# Patient Record
Sex: Male | Born: 1965
Health system: Southern US, Community
[De-identification: ages and names within clinical notes are randomized; demographics above are authoritative.]

## PROBLEM LIST (undated history)

## (undated) DIAGNOSIS — E785 Hyperlipidemia, unspecified: Secondary | ICD-10-CM

## (undated) DIAGNOSIS — I2510 Atherosclerotic heart disease of native coronary artery without angina pectoris: Secondary | ICD-10-CM

## (undated) DIAGNOSIS — Z973 Presence of spectacles and contact lenses: Secondary | ICD-10-CM

## (undated) HISTORY — PX: MOLE REMOVAL: SHX2046

## (undated) HISTORY — DX: Atherosclerotic heart disease of native coronary artery without angina pectoris: I25.10

---

## 1998-12-31 ENCOUNTER — Ambulatory Visit (HOSPITAL_BASED_OUTPATIENT_CLINIC_OR_DEPARTMENT_OTHER): Admission: RE | Admit: 1998-12-31 | Discharge: 1998-12-31 | Payer: Self-pay | Admitting: Surgery

## 2010-04-10 HISTORY — PX: KNEE ARTHROSCOPY W/ LATERAL RELEASE / MEDIAL IMBRICATION: SHX1874

## 2014-03-10 ENCOUNTER — Other Ambulatory Visit: Payer: Self-pay | Admitting: Orthopedic Surgery

## 2014-04-06 ENCOUNTER — Encounter (HOSPITAL_BASED_OUTPATIENT_CLINIC_OR_DEPARTMENT_OTHER): Payer: Self-pay | Admitting: *Deleted

## 2014-04-06 NOTE — Progress Notes (Signed)
No labs needed

## 2014-04-07 ENCOUNTER — Encounter (HOSPITAL_BASED_OUTPATIENT_CLINIC_OR_DEPARTMENT_OTHER)
Admission: RE | Disposition: A | Discharge status: Home / Self Care | Payer: Self-pay | Source: Ambulatory Visit | Attending: Orthopedic Surgery

## 2014-04-07 ENCOUNTER — Ambulatory Visit (HOSPITAL_BASED_OUTPATIENT_CLINIC_OR_DEPARTMENT_OTHER): Payer: BC Managed Care – PPO | Admitting: Certified Registered"

## 2014-04-07 ENCOUNTER — Ambulatory Visit (HOSPITAL_BASED_OUTPATIENT_CLINIC_OR_DEPARTMENT_OTHER)
Admission: RE | Admit: 2014-04-07 | Discharge: 2014-04-07 | Disposition: A | Discharge status: Home / Self Care | Payer: BC Managed Care – PPO | Source: Ambulatory Visit | Attending: Orthopedic Surgery | Admitting: Orthopedic Surgery

## 2014-04-07 ENCOUNTER — Encounter (HOSPITAL_BASED_OUTPATIENT_CLINIC_OR_DEPARTMENT_OTHER): Payer: Self-pay | Admitting: *Deleted

## 2014-04-07 DIAGNOSIS — E785 Hyperlipidemia, unspecified: Secondary | ICD-10-CM | POA: Diagnosis not present

## 2014-04-07 DIAGNOSIS — R2232 Localized swelling, mass and lump, left upper limb: Secondary | ICD-10-CM | POA: Diagnosis present

## 2014-04-07 DIAGNOSIS — D3612 Benign neoplasm of peripheral nerves and autonomic nervous system, upper limb, including shoulder: Secondary | ICD-10-CM | POA: Insufficient documentation

## 2014-04-07 DIAGNOSIS — F17290 Nicotine dependence, other tobacco product, uncomplicated: Secondary | ICD-10-CM | POA: Insufficient documentation

## 2014-04-07 HISTORY — PX: MASS EXCISION: SHX2000

## 2014-04-07 HISTORY — DX: Hyperlipidemia, unspecified: E78.5

## 2014-04-07 HISTORY — DX: Presence of spectacles and contact lenses: Z97.3

## 2014-04-07 LAB — POCT HEMOGLOBIN-HEMACUE: Hemoglobin: 14.4 g/dL (ref 13.0–17.0)

## 2014-04-07 SURGERY — EXCISION MASS
Anesthesia: Monitor Anesthesia Care | Site: Thumb | Laterality: Left

## 2014-04-07 MED ORDER — CHLORHEXIDINE GLUCONATE 4 % EX LIQD
60.0000 mL | Freq: Once | CUTANEOUS | Status: DC
Start: 2014-04-07 — End: 2014-04-07

## 2014-04-07 MED ORDER — LACTATED RINGERS IV SOLN
INTRAVENOUS | Status: DC
  Administered 2014-04-07: 13:00:00 via INTRAVENOUS

## 2014-04-07 MED ORDER — FENTANYL CITRATE 0.05 MG/ML IJ SOLN
INTRAMUSCULAR | Status: AC
  Filled 2014-04-07: qty 4

## 2014-04-07 MED ORDER — OXYCODONE HCL 5 MG/5ML PO SOLN: 5.0000 mg | Freq: Once | ORAL | Status: DC | PRN

## 2014-04-07 MED ORDER — ONDANSETRON HCL 4 MG/2ML IJ SOLN: 4.0000 mg | Freq: Once | INTRAMUSCULAR | Status: DC | PRN

## 2014-04-07 MED ORDER — CEFAZOLIN SODIUM-DEXTROSE 2-3 GM-% IV SOLR
2.0000 g | INTRAVENOUS | Status: AC
  Administered 2014-04-07: 2 g via INTRAVENOUS

## 2014-04-07 MED ORDER — MIDAZOLAM HCL 2 MG/2ML IJ SOLN: 1.0000 mg | INTRAMUSCULAR | Status: DC | PRN

## 2014-04-07 MED ORDER — MIDAZOLAM HCL 2 MG/2ML IJ SOLN
INTRAMUSCULAR | Status: AC
  Filled 2014-04-07: qty 2

## 2014-04-07 MED ORDER — ONDANSETRON HCL 4 MG/2ML IJ SOLN
INTRAMUSCULAR | Status: DC | PRN
  Administered 2014-04-07: 4 mg via INTRAVENOUS

## 2014-04-07 MED ORDER — PROPOFOL 10 MG/ML IV EMUL
INTRAVENOUS | Status: AC
  Filled 2014-04-07: qty 50

## 2014-04-07 MED ORDER — MIDAZOLAM HCL 5 MG/5ML IJ SOLN
INTRAMUSCULAR | Status: DC | PRN
  Administered 2014-04-07: 2 mg via INTRAVENOUS

## 2014-04-07 MED ORDER — CEFAZOLIN SODIUM-DEXTROSE 2-3 GM-% IV SOLR
INTRAVENOUS | Status: AC
Start: 2014-04-07 — End: 2014-04-07
  Filled 2014-04-07: qty 50

## 2014-04-07 MED ORDER — PROPOFOL 10 MG/ML IV BOLUS
INTRAVENOUS | Status: DC | PRN
  Administered 2014-04-07 (×2): 20 mg via INTRAVENOUS

## 2014-04-07 MED ORDER — BUPIVACAINE HCL (PF) 0.25 % IJ SOLN
INTRAMUSCULAR | Status: DC | PRN
  Administered 2014-04-07: 3 mL

## 2014-04-07 MED ORDER — OXYCODONE HCL 5 MG PO TABS: 5.0000 mg | ORAL_TABLET | Freq: Once | ORAL | Status: DC | PRN

## 2014-04-07 MED ORDER — HYDROMORPHONE HCL 1 MG/ML IJ SOLN: 0.2500 mg | INTRAMUSCULAR | Status: DC | PRN

## 2014-04-07 MED ORDER — FENTANYL CITRATE 0.05 MG/ML IJ SOLN
INTRAMUSCULAR | Status: DC | PRN
  Administered 2014-04-07: 100 ug via INTRAVENOUS

## 2014-04-07 MED ORDER — LIDOCAINE HCL (PF) 0.5 % IJ SOLN
INTRAMUSCULAR | Status: DC | PRN
  Administered 2014-04-07: 30 mL via INTRAVENOUS

## 2014-04-07 MED ORDER — FENTANYL CITRATE 0.05 MG/ML IJ SOLN: 50.0000 ug | INTRAMUSCULAR | Status: DC | PRN

## 2014-04-07 MED ORDER — HYDROCODONE-ACETAMINOPHEN 5-325 MG PO TABS: ORAL_TABLET | ORAL | Status: DC

## 2014-04-07 SURGICAL SUPPLY — 54 items
APL SKNCLS STERI-STRIP NONHPOA (GAUZE/BANDAGES/DRESSINGS)
BANDAGE COBAN STERILE 2 (GAUZE/BANDAGES/DRESSINGS) IMPLANT
BANDAGE ELASTIC 3 VELCRO ST LF (GAUZE/BANDAGES/DRESSINGS) IMPLANT
BENZOIN TINCTURE PRP APPL 2/3 (GAUZE/BANDAGES/DRESSINGS) IMPLANT
BLADE MINI RND TIP GREEN BEAV (BLADE) IMPLANT
BLADE SURG 15 STRL LF DISP TIS (BLADE) ×2 IMPLANT
BLADE SURG 15 STRL SS (BLADE) ×4
BNDG CMPR 9X4 STRL LF SNTH (GAUZE/BANDAGES/DRESSINGS)
BNDG CMPR MD 5X2 ELC HKLP STRL (GAUZE/BANDAGES/DRESSINGS)
BNDG COHESIVE 1X5 TAN STRL LF (GAUZE/BANDAGES/DRESSINGS) ×1 IMPLANT
BNDG CONFORM 2 STRL LF (GAUZE/BANDAGES/DRESSINGS) IMPLANT
BNDG ELASTIC 2 VLCR STRL LF (GAUZE/BANDAGES/DRESSINGS) IMPLANT
BNDG ESMARK 4X9 LF (GAUZE/BANDAGES/DRESSINGS) IMPLANT
BNDG GAUZE 1X2.1 STRL (MISCELLANEOUS) IMPLANT
BNDG GAUZE ELAST 4 BULKY (GAUZE/BANDAGES/DRESSINGS) IMPLANT
BNDG PLASTER X FAST 3X3 WHT LF (CAST SUPPLIES) IMPLANT
BNDG PLSTR 9X3 FST ST WHT (CAST SUPPLIES)
CHLORAPREP W/TINT 26ML (MISCELLANEOUS) ×2 IMPLANT
CORDS BIPOLAR (ELECTRODE) ×2 IMPLANT
COVER BACK TABLE 60X90IN (DRAPES) ×2 IMPLANT
COVER MAYO STAND STRL (DRAPES) ×2 IMPLANT
CUFF TOURNIQUET SINGLE 18IN (TOURNIQUET CUFF) ×2 IMPLANT
DRAPE EXTREMITY T 121X128X90 (DRAPE) ×2 IMPLANT
DRAPE SURG 17X23 STRL (DRAPES) ×2 IMPLANT
GAUZE SPONGE 4X4 12PLY STRL (GAUZE/BANDAGES/DRESSINGS) ×2 IMPLANT
GAUZE XEROFORM 1X8 LF (GAUZE/BANDAGES/DRESSINGS) ×2 IMPLANT
GLOVE BIO SURGEON STRL SZ7.5 (GLOVE) ×2 IMPLANT
GLOVE BIOGEL PI IND STRL 8 (GLOVE) ×1 IMPLANT
GLOVE BIOGEL PI INDICATOR 8 (GLOVE) ×1
GOWN STRL REUS W/ TWL LRG LVL3 (GOWN DISPOSABLE) ×1 IMPLANT
GOWN STRL REUS W/TWL LRG LVL3 (GOWN DISPOSABLE) ×2
GOWN STRL REUS W/TWL XL LVL3 (GOWN DISPOSABLE) ×2 IMPLANT
NDL HYPO 25X1 1.5 SAFETY (NEEDLE) ×1 IMPLANT
NEEDLE HYPO 25X1 1.5 SAFETY (NEEDLE) ×2 IMPLANT
NS IRRIG 1000ML POUR BTL (IV SOLUTION) ×2 IMPLANT
PACK BASIN DAY SURGERY FS (CUSTOM PROCEDURE TRAY) ×2 IMPLANT
PAD CAST 3X4 CTTN HI CHSV (CAST SUPPLIES) IMPLANT
PAD CAST 4YDX4 CTTN HI CHSV (CAST SUPPLIES) IMPLANT
PADDING CAST ABS 4INX4YD NS (CAST SUPPLIES) ×1
PADDING CAST ABS COTTON 4X4 ST (CAST SUPPLIES) ×1 IMPLANT
PADDING CAST COTTON 3X4 STRL (CAST SUPPLIES)
PADDING CAST COTTON 4X4 STRL (CAST SUPPLIES)
SPONGE GAUZE 4X4 12PLY STER LF (GAUZE/BANDAGES/DRESSINGS) ×1 IMPLANT
STOCKINETTE 4X48 STRL (DRAPES) ×2 IMPLANT
STRIP CLOSURE SKIN 1/2X4 (GAUZE/BANDAGES/DRESSINGS) IMPLANT
SUT ETHILON 3 0 PS 1 (SUTURE) IMPLANT
SUT ETHILON 4 0 PS 2 18 (SUTURE) ×2 IMPLANT
SUT ETHILON 5 0 P 3 18 (SUTURE)
SUT NYLON ETHILON 5-0 P-3 1X18 (SUTURE) IMPLANT
SUT VIC AB 4-0 P2 18 (SUTURE) IMPLANT
SYR BULB 3OZ (MISCELLANEOUS) ×2 IMPLANT
SYR CONTROL 10ML LL (SYRINGE) ×2 IMPLANT
TOWEL OR 17X24 6PK STRL BLUE (TOWEL DISPOSABLE) ×4 IMPLANT
UNDERPAD 30X30 INCONTINENT (UNDERPADS AND DIAPERS) ×2 IMPLANT

## 2014-04-07 NOTE — H&P (Signed)
  Jason Chen is an 48 y.o. male.   Chief Complaint: left thumb mass HPI: 48 yo lhd male states he has had mass on left thumb x 20 years.  He does not remember any specific injury to the thumb.  It is bothersome with palpation and when he bumps it.  He wishes to have it removed.  Past Medical History  Diagnosis Date  . Hyperlipemia   . Wears glasses     Past Surgical History  Procedure Laterality Date  . Knee arthroscopy w/ lateral release / medial imbrication  2012    left  . Mole removal      History reviewed. No pertinent family history. Social History:  reports that he has been smoking Cigars.  He does not have any smokeless tobacco history on file. He reports that he drinks alcohol. He reports that he does not use illicit drugs.  Allergies: No Known Allergies  No prescriptions prior to admission    No results found for this or any previous visit (from the past 48 hour(s)).  No results found.   A comprehensive review of systems was negative except for: Eyes: positive for contacts/glasses  Height 5\' 11"  (1.803 m), weight 73.483 kg (162 lb).  General appearance: alert, cooperative and appears stated age Head: Normocephalic, without obvious abnormality, atraumatic Neck: supple, symmetrical, trachea midline Resp: clear to auscultation bilaterally Cardio: regular rate and rhythm GI: non tender Extremities: intact sensation and capillary refill all digits.  +epl/fpl/io.  mass on ulnar side of thumb.  no skin changes. Pulses: 2+ and symmetric Skin: Skin color, texture, turgor normal. No rashes or lesions Neurologic: Grossly normal Incision/Wound: none  Assessment/Plan Left thumb mass.  Non operative and operative treatment options were discussed with the patient and patient wishes to proceed with operative treatment. Risks, benefits, and alternatives of surgery were discussed and the patient agrees with the plan of care.   Jason Chen R 04/07/2014, 9:52  AM

## 2014-04-07 NOTE — Discharge Instructions (Addendum)

## 2014-04-07 NOTE — Anesthesia Preprocedure Evaluation (Signed)
Anesthesia Evaluation  Patient identified by MRN, date of birth, ID band Patient awake    Reviewed: Allergy & Precautions, H&P , NPO status , Patient's Chart, lab work & pertinent test results  Airway Mallampati: I  TM Distance: >3 FB Neck ROM: Full    Dental  (+) Teeth Intact, Dental Advisory Given   Pulmonary Current Smoker,  breath sounds clear to auscultation        Cardiovascular Rhythm:Regular Rate:Normal     Neuro/Psych    GI/Hepatic   Endo/Other    Renal/GU      Musculoskeletal   Abdominal   Peds  Hematology   Anesthesia Other Findings   Reproductive/Obstetrics                             Anesthesia Physical Anesthesia Plan  ASA: I  Anesthesia Plan: MAC and Bier Block   Post-op Pain Management:    Induction: Intravenous  Airway Management Planned: Simple Face Mask  Additional Equipment:   Intra-op Plan:   Post-operative Plan:   Informed Consent: I have reviewed the patients History and Physical, chart, labs and discussed the procedure including the risks, benefits and alternatives for the proposed anesthesia with the patient or authorized representative who has indicated his/her understanding and acceptance.   Dental advisory given  Plan Discussed with: CRNA, Surgeon and Anesthesiologist  Anesthesia Plan Comments:         Anesthesia Quick Evaluation

## 2014-04-07 NOTE — Brief Op Note (Signed)
04/07/2014  3:03 PM  PATIENT:  Jason Chen  48 y.o. male  PRE-OPERATIVE DIAGNOSIS:  LEFT THUMB MASS   POST-OPERATIVE DIAGNOSIS:  LEFT THUMB MASS   PROCEDURE:  Procedure(s): EXCISION MASS LEFT THUMB  (Left)  SURGEON:  Surgeon(s) and Role:    * Leanora Cover, MD - Primary    * Daryll Brod, MD - Assisting  PHYSICIAN ASSISTANT:   ASSISTANTS: Daryll Brod, MD   ANESTHESIA:   Bier block with sedation  EBL:  Total I/O In: 750 [I.V.:750] Out: -   BLOOD ADMINISTERED:none  DRAINS: none   LOCAL MEDICATIONS USED:  MARCAINE     SPECIMEN:  Source of Specimen:  left thumb  DISPOSITION OF SPECIMEN:  PATHOLOGY  COUNTS:  YES  TOURNIQUET:   Total Tourniquet Time Documented: Forearm (Left) - 18 minutes Total: Forearm (Left) - 18 minutes   DICTATION: .Other Dictation: Dictation Number [id number]  PLAN OF CARE: Discharge to home after PACU  PATIENT DISPOSITION:  PACU - hemodynamically stable.

## 2014-04-07 NOTE — Transfer of Care (Signed)
Immediate Anesthesia Transfer of Care Note  Patient: Jason Chen  Procedure(s) Performed: Procedure(s): EXCISION MASS LEFT THUMB  (Left)  Patient Location: PACU  Anesthesia Type:MAC and Bier block  Level of Consciousness: awake, alert  and oriented  Airway & Oxygen Therapy: Patient Spontanous Breathing and Patient connected to face mask oxygen  Post-op Assessment: Report given to PACU RN and Post -op Vital signs reviewed and stable  Post vital signs: Reviewed and stable  Complications: No apparent anesthesia complications

## 2014-04-07 NOTE — Op Note (Signed)
Jason Chen, Jason Chen             ACCOUNT NO.:  [id number]  MEDICAL RECORD NO.:  [id number]  LOCATION:                                 FACILITY:  PHYSICIAN:  Leanora Cover, MD             DATE OF BIRTH:  DATE OF PROCEDURE:  04/07/2014 DATE OF DISCHARGE:                              OPERATIVE REPORT   PREOPERATIVE DIAGNOSIS:  Left thumb mass.  POSTOPERATIVE DIAGNOSIS:  Left thumb mass.  PROCEDURE:  Excision mass left thumb approximately 1 mm.  SURGEON:  Leanora Cover, MD  ASSISTANT:  Daryll Brod, MD  ANESTHESIA:  Bier block with sedation.  IV FLUIDS:  Per anesthesia flow sheet.  ESTIMATED BLOOD LOSS:  Minimal.  COMPLICATIONS:  None.  SPECIMENS:  Left thumb mass to Pathology.  TOURNIQUET TIME:  18 minutes.  DISPOSITION:  Stable to PACU.  INDICATIONS:  Jason Chen is a 48 year old male who was noted to have a mass in his left thumb for 15-20 years.  This is bothersome to him. It is painful when bumped.  He wished to have it removed.  Risks, benefits, and alternatives of surgery were discussed including risk of blood loss, infection, damage to nerves, vessels, tendons, ligaments, bone, failure of surgery, need for additional surgery, complications with wound healing, continued pain, and recurrence of mass.  He voiced understanding of these risks and elected to proceed.  OPERATIVE COURSE:  After being identified preoperatively by myself, the patient and I agreed upon procedure and site of procedure.  Surgical site was marked.  The risks, benefits, and alternatives of surgery were reviewed and he wished to proceed.  Surgical consent had been signed. He was given IV Ancef as preoperative antibiotic prophylaxis.  He was transported to the operating room and placed on the operating room table in supine position with the left upper extremity on arm board.  Bier block anesthesia was induced by anesthesiologist.  The left upper extremity was prepped and draped in normal  sterile orthopedic fashion. A surgical pause was performed between surgeons, anesthesia, and operating room staff, and all were in agreement as to the patient, procedure, and site of procedure.  Tourniquet at the proximal aspect of the forearm had been inflated for the Bier block.  Incision was made over the mass and carried into subcutaneous tissues by spreading technique.  Bipolar electrocautery was used to obtain hemostasis.  The mass was easily identified and came out easily.  It had minimal adherence.  It had 2 small vessels going into it that were treated with bipolar.  The mass was sent to Pathology for examination.  It measured approximately 1 cm.  The skin was placed back over top of the wound and the area palpated.  No remaining mass was noted.  The wound was copiously irrigated with sterile saline.  It was closed with 4-0 nylon in a horizontal mattress fashion.  The wound was injected with 3 mL of 0.25% plain Marcaine to aid in postoperative analgesia.  It was then dressed with sterile Xeroform, 4 x 4 and wrapped with a Coban dressing lightly.  Tourniquet was deflated at 18 minutes.  Fingertips were pink with brisk capillary  refill after deflation of the tourniquet. Operative drapes were broken down, and the patient was awoken from anesthesia safely.  He was transferred back to stretcher and taken to PACU in stable condition.  I will see him back in the office in 1 week for postoperative followup.  I will give him Norco 5/325, 1-2 p.o. q.6 hours p.r.n. pain, dispensed #20.     Leanora Cover, MD     KK/MEDQ  D:  04/07/2014  T:  04/07/2014  Job:  [id number]

## 2014-04-07 NOTE — Anesthesia Postprocedure Evaluation (Signed)
  Anesthesia Post-op Note  Patient: Jason Chen  Procedure(s) Performed: Procedure(s): EXCISION MASS LEFT THUMB  (Left)  Patient Location: PACU  Anesthesia Type: MAC, Bier Block   Level of Consciousness: awake, alert  and oriented  Airway and Oxygen Therapy: Patient Spontanous Breathing  Post-op Pain: none  Post-op Assessment: Post-op Vital signs reviewed  Post-op Vital Signs: Reviewed  Last Vitals:  Filed Vitals:   04/07/14 1530  BP: 126/89  Pulse: 68  Temp:   Resp: 13    Complications: No apparent anesthesia complications

## 2014-04-07 NOTE — Op Note (Signed)
[id number] 

## 2014-04-08 ENCOUNTER — Encounter (HOSPITAL_BASED_OUTPATIENT_CLINIC_OR_DEPARTMENT_OTHER): Payer: Self-pay | Admitting: Orthopedic Surgery

## 2015-08-17 DIAGNOSIS — E785 Hyperlipidemia, unspecified: Secondary | ICD-10-CM | POA: Diagnosis not present

## 2015-10-18 DIAGNOSIS — R5383 Other fatigue: Secondary | ICD-10-CM | POA: Diagnosis not present

## 2015-10-18 DIAGNOSIS — E291 Testicular hypofunction: Secondary | ICD-10-CM | POA: Diagnosis not present

## 2015-10-18 DIAGNOSIS — R739 Hyperglycemia, unspecified: Secondary | ICD-10-CM | POA: Diagnosis not present

## 2015-10-18 DIAGNOSIS — N401 Enlarged prostate with lower urinary tract symptoms: Secondary | ICD-10-CM | POA: Diagnosis not present

## 2015-11-04 DIAGNOSIS — E559 Vitamin D deficiency, unspecified: Secondary | ICD-10-CM | POA: Diagnosis not present

## 2015-11-04 DIAGNOSIS — N401 Enlarged prostate with lower urinary tract symptoms: Secondary | ICD-10-CM | POA: Diagnosis not present

## 2015-11-04 DIAGNOSIS — E291 Testicular hypofunction: Secondary | ICD-10-CM | POA: Diagnosis not present

## 2015-11-04 DIAGNOSIS — R5383 Other fatigue: Secondary | ICD-10-CM | POA: Diagnosis not present

## 2016-01-14 DIAGNOSIS — H5213 Myopia, bilateral: Secondary | ICD-10-CM | POA: Diagnosis not present

## 2016-02-14 DIAGNOSIS — R5383 Other fatigue: Secondary | ICD-10-CM | POA: Diagnosis not present

## 2016-02-14 DIAGNOSIS — E785 Hyperlipidemia, unspecified: Secondary | ICD-10-CM | POA: Diagnosis not present

## 2016-02-14 DIAGNOSIS — Z23 Encounter for immunization: Secondary | ICD-10-CM | POA: Diagnosis not present

## 2016-02-14 DIAGNOSIS — Z0389 Encounter for observation for other suspected diseases and conditions ruled out: Secondary | ICD-10-CM | POA: Diagnosis not present

## 2016-02-14 DIAGNOSIS — R739 Hyperglycemia, unspecified: Secondary | ICD-10-CM | POA: Diagnosis not present

## 2016-02-14 DIAGNOSIS — E291 Testicular hypofunction: Secondary | ICD-10-CM | POA: Diagnosis not present

## 2016-02-14 DIAGNOSIS — Z0000 Encounter for general adult medical examination without abnormal findings: Secondary | ICD-10-CM | POA: Diagnosis not present

## 2016-02-14 DIAGNOSIS — E559 Vitamin D deficiency, unspecified: Secondary | ICD-10-CM | POA: Diagnosis not present

## 2016-06-15 DIAGNOSIS — R739 Hyperglycemia, unspecified: Secondary | ICD-10-CM | POA: Diagnosis not present

## 2016-06-15 DIAGNOSIS — N401 Enlarged prostate with lower urinary tract symptoms: Secondary | ICD-10-CM | POA: Diagnosis not present

## 2016-06-15 DIAGNOSIS — E291 Testicular hypofunction: Secondary | ICD-10-CM | POA: Diagnosis not present

## 2016-06-15 DIAGNOSIS — E785 Hyperlipidemia, unspecified: Secondary | ICD-10-CM | POA: Diagnosis not present

## 2016-09-19 DIAGNOSIS — E559 Vitamin D deficiency, unspecified: Secondary | ICD-10-CM | POA: Diagnosis not present

## 2016-09-19 DIAGNOSIS — E291 Testicular hypofunction: Secondary | ICD-10-CM | POA: Diagnosis not present

## 2016-09-19 DIAGNOSIS — E785 Hyperlipidemia, unspecified: Secondary | ICD-10-CM | POA: Diagnosis not present

## 2017-01-04 DIAGNOSIS — Z85820 Personal history of malignant melanoma of skin: Secondary | ICD-10-CM | POA: Diagnosis not present

## 2017-01-04 DIAGNOSIS — L814 Other melanin hyperpigmentation: Secondary | ICD-10-CM | POA: Diagnosis not present

## 2017-01-04 DIAGNOSIS — D1801 Hemangioma of skin and subcutaneous tissue: Secondary | ICD-10-CM | POA: Diagnosis not present

## 2017-01-04 DIAGNOSIS — D225 Melanocytic nevi of trunk: Secondary | ICD-10-CM | POA: Diagnosis not present

## 2017-01-04 DIAGNOSIS — L570 Actinic keratosis: Secondary | ICD-10-CM | POA: Diagnosis not present

## 2017-02-14 DIAGNOSIS — R5383 Other fatigue: Secondary | ICD-10-CM | POA: Diagnosis not present

## 2017-02-14 DIAGNOSIS — Z23 Encounter for immunization: Secondary | ICD-10-CM | POA: Diagnosis not present

## 2017-02-14 DIAGNOSIS — E785 Hyperlipidemia, unspecified: Secondary | ICD-10-CM | POA: Diagnosis not present

## 2017-02-14 DIAGNOSIS — Z0389 Encounter for observation for other suspected diseases and conditions ruled out: Secondary | ICD-10-CM | POA: Diagnosis not present

## 2017-02-14 DIAGNOSIS — E291 Testicular hypofunction: Secondary | ICD-10-CM | POA: Diagnosis not present

## 2017-02-14 DIAGNOSIS — Z125 Encounter for screening for malignant neoplasm of prostate: Secondary | ICD-10-CM | POA: Diagnosis not present

## 2017-02-14 DIAGNOSIS — N401 Enlarged prostate with lower urinary tract symptoms: Secondary | ICD-10-CM | POA: Diagnosis not present

## 2017-02-14 DIAGNOSIS — Z0000 Encounter for general adult medical examination without abnormal findings: Secondary | ICD-10-CM | POA: Diagnosis not present

## 2017-02-14 DIAGNOSIS — R739 Hyperglycemia, unspecified: Secondary | ICD-10-CM | POA: Diagnosis not present

## 2017-02-14 DIAGNOSIS — E559 Vitamin D deficiency, unspecified: Secondary | ICD-10-CM | POA: Diagnosis not present

## 2017-03-07 DIAGNOSIS — H5213 Myopia, bilateral: Secondary | ICD-10-CM | POA: Diagnosis not present

## 2017-04-04 DIAGNOSIS — H43812 Vitreous degeneration, left eye: Secondary | ICD-10-CM | POA: Diagnosis not present

## 2017-05-11 DIAGNOSIS — J209 Acute bronchitis, unspecified: Secondary | ICD-10-CM | POA: Diagnosis not present

## 2017-05-17 DIAGNOSIS — Z23 Encounter for immunization: Secondary | ICD-10-CM | POA: Diagnosis not present

## 2017-06-06 DIAGNOSIS — R972 Elevated prostate specific antigen [PSA]: Secondary | ICD-10-CM | POA: Diagnosis not present

## 2017-06-13 DIAGNOSIS — E291 Testicular hypofunction: Secondary | ICD-10-CM | POA: Diagnosis not present

## 2017-06-13 DIAGNOSIS — R972 Elevated prostate specific antigen [PSA]: Secondary | ICD-10-CM | POA: Diagnosis not present

## 2017-06-20 DIAGNOSIS — R102 Pelvic and perineal pain: Secondary | ICD-10-CM | POA: Diagnosis not present

## 2017-06-20 DIAGNOSIS — E291 Testicular hypofunction: Secondary | ICD-10-CM | POA: Diagnosis not present

## 2017-06-20 DIAGNOSIS — R972 Elevated prostate specific antigen [PSA]: Secondary | ICD-10-CM | POA: Diagnosis not present

## 2017-06-29 DIAGNOSIS — T7840XA Allergy, unspecified, initial encounter: Secondary | ICD-10-CM | POA: Diagnosis not present

## 2017-06-29 DIAGNOSIS — R220 Localized swelling, mass and lump, head: Secondary | ICD-10-CM | POA: Diagnosis not present

## 2017-08-15 DIAGNOSIS — E291 Testicular hypofunction: Secondary | ICD-10-CM | POA: Diagnosis not present

## 2017-08-15 DIAGNOSIS — R972 Elevated prostate specific antigen [PSA]: Secondary | ICD-10-CM | POA: Diagnosis not present

## 2017-08-15 DIAGNOSIS — R8271 Bacteriuria: Secondary | ICD-10-CM | POA: Diagnosis not present

## 2017-08-15 DIAGNOSIS — R825 Elevated urine levels of drugs, medicaments and biological substances: Secondary | ICD-10-CM | POA: Diagnosis not present

## 2017-09-04 DIAGNOSIS — R972 Elevated prostate specific antigen [PSA]: Secondary | ICD-10-CM | POA: Diagnosis not present

## 2017-09-04 DIAGNOSIS — E785 Hyperlipidemia, unspecified: Secondary | ICD-10-CM | POA: Diagnosis not present

## 2017-09-04 DIAGNOSIS — R739 Hyperglycemia, unspecified: Secondary | ICD-10-CM | POA: Diagnosis not present

## 2017-09-14 DIAGNOSIS — E291 Testicular hypofunction: Secondary | ICD-10-CM | POA: Diagnosis not present

## 2017-09-14 DIAGNOSIS — R972 Elevated prostate specific antigen [PSA]: Secondary | ICD-10-CM | POA: Diagnosis not present

## 2017-10-25 DIAGNOSIS — L814 Other melanin hyperpigmentation: Secondary | ICD-10-CM | POA: Diagnosis not present

## 2017-10-25 DIAGNOSIS — C44719 Basal cell carcinoma of skin of left lower limb, including hip: Secondary | ICD-10-CM | POA: Diagnosis not present

## 2017-10-25 DIAGNOSIS — D2262 Melanocytic nevi of left upper limb, including shoulder: Secondary | ICD-10-CM | POA: Diagnosis not present

## 2017-10-25 DIAGNOSIS — D2261 Melanocytic nevi of right upper limb, including shoulder: Secondary | ICD-10-CM | POA: Diagnosis not present

## 2017-10-25 DIAGNOSIS — Z85820 Personal history of malignant melanoma of skin: Secondary | ICD-10-CM | POA: Diagnosis not present

## 2017-11-28 DIAGNOSIS — R972 Elevated prostate specific antigen [PSA]: Secondary | ICD-10-CM | POA: Diagnosis not present

## 2018-02-18 DIAGNOSIS — R911 Solitary pulmonary nodule: Secondary | ICD-10-CM | POA: Diagnosis not present

## 2018-02-18 DIAGNOSIS — Z23 Encounter for immunization: Secondary | ICD-10-CM | POA: Diagnosis not present

## 2018-02-18 DIAGNOSIS — R972 Elevated prostate specific antigen [PSA]: Secondary | ICD-10-CM | POA: Diagnosis not present

## 2018-02-18 DIAGNOSIS — R5383 Other fatigue: Secondary | ICD-10-CM | POA: Diagnosis not present

## 2018-02-18 DIAGNOSIS — R739 Hyperglycemia, unspecified: Secondary | ICD-10-CM | POA: Diagnosis not present

## 2018-02-18 DIAGNOSIS — E559 Vitamin D deficiency, unspecified: Secondary | ICD-10-CM | POA: Diagnosis not present

## 2018-02-18 DIAGNOSIS — Z0000 Encounter for general adult medical examination without abnormal findings: Secondary | ICD-10-CM | POA: Diagnosis not present

## 2018-02-18 DIAGNOSIS — Z0389 Encounter for observation for other suspected diseases and conditions ruled out: Secondary | ICD-10-CM | POA: Diagnosis not present

## 2018-02-18 DIAGNOSIS — E785 Hyperlipidemia, unspecified: Secondary | ICD-10-CM | POA: Diagnosis not present

## 2018-04-30 DIAGNOSIS — L111 Transient acantholytic dermatosis [Grover]: Secondary | ICD-10-CM | POA: Diagnosis not present

## 2018-04-30 DIAGNOSIS — L308 Other specified dermatitis: Secondary | ICD-10-CM | POA: Diagnosis not present

## 2018-09-03 DIAGNOSIS — L0101 Non-bullous impetigo: Secondary | ICD-10-CM | POA: Diagnosis not present

## 2018-09-03 DIAGNOSIS — L111 Transient acantholytic dermatosis [Grover]: Secondary | ICD-10-CM | POA: Diagnosis not present

## 2018-09-03 DIAGNOSIS — L309 Dermatitis, unspecified: Secondary | ICD-10-CM | POA: Diagnosis not present

## 2018-09-03 DIAGNOSIS — L011 Impetiginization of other dermatoses: Secondary | ICD-10-CM | POA: Diagnosis not present

## 2018-10-28 ENCOUNTER — Other Ambulatory Visit: Payer: Self-pay

## 2018-10-28 DIAGNOSIS — Z20822 Contact with and (suspected) exposure to covid-19: Secondary | ICD-10-CM

## 2018-11-01 LAB — NOVEL CORONAVIRUS, NAA: SARS-CoV-2, NAA: NOT DETECTED

## 2018-12-30 DIAGNOSIS — R972 Elevated prostate specific antigen [PSA]: Secondary | ICD-10-CM | POA: Diagnosis not present

## 2019-01-22 DIAGNOSIS — E291 Testicular hypofunction: Secondary | ICD-10-CM | POA: Diagnosis not present

## 2019-01-22 DIAGNOSIS — R350 Frequency of micturition: Secondary | ICD-10-CM | POA: Diagnosis not present

## 2019-01-22 DIAGNOSIS — R972 Elevated prostate specific antigen [PSA]: Secondary | ICD-10-CM | POA: Diagnosis not present

## 2019-01-22 DIAGNOSIS — R3912 Poor urinary stream: Secondary | ICD-10-CM | POA: Diagnosis not present

## 2019-01-28 ENCOUNTER — Other Ambulatory Visit: Payer: Self-pay

## 2019-01-28 DIAGNOSIS — Z20822 Contact with and (suspected) exposure to covid-19: Secondary | ICD-10-CM

## 2019-01-28 DIAGNOSIS — Z20828 Contact with and (suspected) exposure to other viral communicable diseases: Secondary | ICD-10-CM | POA: Diagnosis not present

## 2019-01-29 LAB — NOVEL CORONAVIRUS, NAA: SARS-CoV-2, NAA: NOT DETECTED

## 2019-02-10 ENCOUNTER — Other Ambulatory Visit: Payer: Self-pay

## 2019-02-10 DIAGNOSIS — Z20822 Contact with and (suspected) exposure to covid-19: Secondary | ICD-10-CM

## 2019-02-11 LAB — NOVEL CORONAVIRUS, NAA: SARS-CoV-2, NAA: NOT DETECTED

## 2019-02-13 ENCOUNTER — Other Ambulatory Visit: Payer: Self-pay

## 2019-02-13 DIAGNOSIS — Z20822 Contact with and (suspected) exposure to covid-19: Secondary | ICD-10-CM

## 2019-02-14 LAB — NOVEL CORONAVIRUS, NAA: SARS-CoV-2, NAA: NOT DETECTED

## 2019-02-24 ENCOUNTER — Encounter (INDEPENDENT_AMBULATORY_CARE_PROVIDER_SITE_OTHER): Payer: Self-pay | Admitting: Internal Medicine

## 2019-02-26 DIAGNOSIS — L738 Other specified follicular disorders: Secondary | ICD-10-CM | POA: Diagnosis not present

## 2019-02-26 DIAGNOSIS — D1801 Hemangioma of skin and subcutaneous tissue: Secondary | ICD-10-CM | POA: Diagnosis not present

## 2019-02-26 DIAGNOSIS — L720 Epidermal cyst: Secondary | ICD-10-CM | POA: Diagnosis not present

## 2019-03-17 ENCOUNTER — Encounter (INDEPENDENT_AMBULATORY_CARE_PROVIDER_SITE_OTHER): Payer: Self-pay | Admitting: Internal Medicine

## 2019-03-17 ENCOUNTER — Other Ambulatory Visit: Payer: Self-pay

## 2019-03-17 ENCOUNTER — Ambulatory Visit (INDEPENDENT_AMBULATORY_CARE_PROVIDER_SITE_OTHER): Payer: BC Managed Care – PPO | Admitting: Internal Medicine

## 2019-03-17 VITALS — BP 110/60 | HR 72 | Ht 71.0 in | Wt 161.4 lb

## 2019-03-17 DIAGNOSIS — Z131 Encounter for screening for diabetes mellitus: Secondary | ICD-10-CM

## 2019-03-17 DIAGNOSIS — Z8249 Family history of ischemic heart disease and other diseases of the circulatory system: Secondary | ICD-10-CM

## 2019-03-17 DIAGNOSIS — E785 Hyperlipidemia, unspecified: Secondary | ICD-10-CM

## 2019-03-17 DIAGNOSIS — N401 Enlarged prostate with lower urinary tract symptoms: Secondary | ICD-10-CM | POA: Diagnosis not present

## 2019-03-17 DIAGNOSIS — N3943 Post-void dribbling: Secondary | ICD-10-CM

## 2019-03-17 DIAGNOSIS — Z0001 Encounter for general adult medical examination with abnormal findings: Secondary | ICD-10-CM | POA: Diagnosis not present

## 2019-03-17 DIAGNOSIS — E782 Mixed hyperlipidemia: Secondary | ICD-10-CM

## 2019-03-17 DIAGNOSIS — E559 Vitamin D deficiency, unspecified: Secondary | ICD-10-CM | POA: Diagnosis not present

## 2019-03-17 DIAGNOSIS — Z139 Encounter for screening, unspecified: Secondary | ICD-10-CM | POA: Diagnosis not present

## 2019-03-17 DIAGNOSIS — N400 Enlarged prostate without lower urinary tract symptoms: Secondary | ICD-10-CM

## 2019-03-17 HISTORY — DX: Benign prostatic hyperplasia without lower urinary tract symptoms: N40.0

## 2019-03-17 HISTORY — DX: Hyperlipidemia, unspecified: E78.5

## 2019-03-17 NOTE — Progress Notes (Signed)
Chief Complaint: This 53 year old man comes in for an annual physical exam and to address his chronic conditions which are described below. HPI: He has a history of hyperlipidemia which is not severe but at one point he did take statin therapy but he has not been taking any recently.  His father had a heart attack at the age of 36 years old apparently.  The patient himself does not have any symptoms whatsoever of coronary artery disease.  He exercises on a regular basis and tries to eat healthy. He does have BPH symptoms and takes time lotion for this which have helped his symptoms tremendously.  He also takes Cialis sometimes for erectile dysfunction but also this does seem to help his BPH symptoms. In the past, he had prediabetes but more recently his hemoglobin A1c has been acceptable.  Past Medical History:  Diagnosis Date  . BPH (benign prostatic hyperplasia) 03/17/2019  . HLD (hyperlipidemia) 03/17/2019  . Hyperlipemia   . Wears glasses    Past Surgical History:  Procedure Laterality Date  . KNEE ARTHROSCOPY W/ LATERAL RELEASE / MEDIAL IMBRICATION  2012   left  . MASS EXCISION Left 04/07/2014   Procedure: EXCISION MASS LEFT THUMB ;  Surgeon: Leanora Cover, MD;  Location: [facility];  Service: Orthopedics;  Laterality: Left;  . 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    Social History   Tobacco Use  . Smoking status: Never Smoker  . Smokeless tobacco: Never Used  Substance Use Topics  . Alcohol use: Yes    Alcohol/week: 5.0 standard drinks    Types: 5 Cans of beer per week      Allergies: No Known Allergies   Current Meds  Medication Sig  . tadalafil (CIALIS) 5 MG tablet Take 5 mg by mouth daily.  . tamsulosin (FLOMAX) 0.4 MG CAPS capsule Take 0.4 mg by mouth daily.      EBR:AXENM from the symptoms mentioned above,there are no other symptoms referable to all systems  reviewed.  Physical Exam: Blood pressure 110/60, pulse 72, height '5\' 11"'  (1.803 m), weight 161 lb 6.4 oz (73.2 kg). Vitals with BMI 03/17/2019 04/07/2014 04/07/2014  Height '5\' 11"'  - -  Weight 161 lbs 6 oz - -  BMI 07.68 - -  Systolic 088 110 315  Diastolic 60 66 89  Pulse 72 65 68      He looks systemically well. General: Alert, cooperative, and appears to be the stated age.No pallor.  No jaundice.  No clubbing. Head: Normocephalic Eyes: Sclera white, pupils equal and reactive to light, red reflex x 2,  Ears: Normal bilaterally Oral cavity: Lips, mucosa, and tongue normal: Teeth and gums normal Neck: No adenopathy, supple, symmetrical, trachea midline, and thyroid does not appear enlarged Respiratory: Clear to auscultation bilaterally.No wheezing, crackles or bronchial breathing. Cardiovascular: Heart sounds are present and appear to be normal without murmurs or added sounds.  No carotid bruits.  Peripheral pulses are present and equal bilaterally.: Gastrointestinal:positive bowel sounds, no hepatosplenomegaly.  No masses felt.No tenderness. Skin: Clear, No rashes noted.No worrisome skin lesions seen. Neurological: Grossly intact without focal findings, cranial nerves II through XII intact, muscle strength equal bilaterally Musculoskeletal: No acute joint abnormalities noted.Full range of movement noted with joints. Psychiatric: Affect appropriate, non-anxious.    Assessment  1. Encounter for general adult medical examination with abnormal findings   2. Mixed hyperlipidemia  3. Benign prostatic hyperplasia with post-void dribbling   4. Vitamin D deficiency disease   5. Screening for diabetes mellitus   6. FH: heart attack     Tests Ordered:   Orders Placed This Encounter  Procedures  . CBC  . CMP with eGFR([facility])  . Hemoglobin A1c  . Lipid Panel  . T3, Free  . T4  . TSH  . Vitamin D, 25-hydroxy  . Ambulatory referral to Cardiology     Plan  1. Blood work is  ordered as above. 2. He was given Tdap vaccination today. 3. At his request, I will refer him to cardiology in view of his father having early heart attack and his history of hyperlipidemia.  He may want to get a stress test done. 4. Further recommendations will depend on blood results and I will see him in 1 years time for an annual physical exam.     No orders of the defined types were placed in this encounter.     C    03/17/2019, 10:43 AM

## 2019-03-17 NOTE — Patient Instructions (Signed)
[facility] Dietary Recommendations for Weight Loss What to Avoid . Avoid added sugars o Often added sugar can be found in processed foods such as many condiments, dry cereals, cakes, cookies, chips, crisps, crackers, candies, sweetened drinks, etc.  o Read labels and AVOID/DECREASE use of foods with the following in their ingredient list: Sugar, fructose, high fructose corn syrup, sucrose, glucose, maltose, dextrose, molasses, cane sugar, brown sugar, any type of syrup, agave nectar, etc.   . Avoid snacking in between meals . Avoid foods made with flour o If you are going to eat food made with flour, choose those made with whole-grains; and, minimize your consumption as much as is tolerable . Avoid processed foods o These foods are generally stocked in the middle of the grocery store. Focus on shopping on the perimeter of the grocery.  What to Include . Vegetables o GREEN LEAFY VEGETABLES: Kale, spinach, mustard greens, collard greens, cabbage, broccoli, etc. o OTHER: Asparagus, cauliflower, eggplant, carrots, peas, Brussel sprouts, tomatoes, bell peppers, zucchini, beets, cucumbers, etc. . Grains, seeds, and legumes o Beans: kidney beans, black eyed peas, garbanzo beans, black beans, pinto beans, etc. o Whole, unrefined grains: brown rice, barley, bulgur, oatmeal, etc. . Healthy fats  o Avoid highly processed fats such as vegetable oil o Examples of healthy fats: avocado, olives, virgin olive oil, dark chocolate (?72% Cocoa), nuts (peanuts, almonds, walnuts, cashews, pecans, etc.) . Low - Moderate Intake of Animal Sources of Protein o Meat sources: chicken, turkey, salmon, tuna. Limit to 4 ounces of meat at one time. o Consider limiting dairy sources, but when choosing dairy focus on: PLAIN Greek yogurt, cottage cheese, high-protein milk . Fruit o Choose berries  When to Eat . Intermittent Fasting: o Choosing not to eat for a specific time period, but DO FOCUS ON HYDRATION  when fasting o Multiple Techniques: - Time Restricted Eating: eat 3 meals in a day, each meal lasting no more than 60 minutes, no snacks between meals - 16-18 hour fast: fast for 16 to 18 hours up to 7 days a week. Often suggested to start with 2-3 nonconsecutive days per week.  . Remember the time you sleep is counted as fasting.  . Examples of eating schedule: Fast from 7:00pm-11:00am. Eat between 11:00am-7:00pm.  - 24-hour fast: fast for 24 hours up to every other day. Often suggested to start with 1 day per week . Remember the time you sleep is counted as fasting . Examples of eating schedule:  o Eating day: eat 2-3 meals on your eating day. If doing 2 meals, each meal should last no more than 90 minutes. If doing 3 meals, each meal should last no more than 60 minutes. Finish last meal by 7:00pm. o Fasting day: Fast until 7:00pm.  o IF YOU FEEL UNWELL FOR ANY REASON/IN ANY WAY WHEN FASTING, STOP FASTING BY EATING A NUTRITIOUS SNACK OR LIGHT MEAL o ALWAYS FOCUS ON HYDRATION DURING FASTS - Acceptable Hydration sources: water, broths, tea/coffee (black tea/coffee is best but using a small amount of whole-fat dairy products in coffee/tea is acceptable).  - Poor Hydration Sources: anything with sugar or artificial sweeteners added to it  These recommendations have been developed for patients that are actively receiving medical care from either Dr. Yardley Lekas or Sarah Gray, DNP, NP-C at [facility]. These recommendations are developed for patients with specific medical conditions and are not meant to be distributed or used by others that are not actively receiving care from either provider listed   above at [facility]. It is not appropriate to participate in the above eating plans without proper medical supervision.   Reference: Fung, J. The obesity code. [place]: [place]; 2016.   

## 2019-03-18 LAB — COMPLETE METABOLIC PANEL WITH GFR
AG Ratio: 1.8 (calc) (ref 1.0–2.5)
ALT: 36 U/L (ref 9–46)
AST: 21 U/L (ref 10–35)
Albumin: 4.6 g/dL (ref 3.6–5.1)
Alkaline phosphatase (APISO): 96 U/L (ref 35–144)
BUN: 19 mg/dL (ref 7–25)
CO2: 25 mmol/L (ref 20–32)
Calcium: 9.8 mg/dL (ref 8.6–10.3)
Chloride: 105 mmol/L (ref 98–110)
Creat: 0.94 mg/dL (ref 0.70–1.33)
GFR, Est African American: 106 mL/min/{1.73_m2} (ref >=60)
GFR, Est Non African American: 92 mL/min/{1.73_m2} (ref >=60)
Globulin: 2.6 g/dL (calc) (ref 1.9–3.7)
Glucose, Bld: 101 mg/dL — ABNORMAL HIGH (ref 65–99)
Potassium: 5 mmol/L (ref 3.5–5.3)
Sodium: 140 mmol/L (ref 135–146)
Total Bilirubin: 0.5 mg/dL (ref 0.2–1.2)
Total Protein: 7.2 g/dL (ref 6.1–8.1)

## 2019-03-18 LAB — CBC
HCT: 44.3 % (ref 38.5–50.0)
Hemoglobin: 15.3 g/dL (ref 13.2–17.1)
MCH: 30.1 pg (ref 27.0–33.0)
MCHC: 34.5 g/dL (ref 32.0–36.0)
MCV: 87.2 fL (ref 80.0–100.0)
MPV: 10.8 fL (ref 7.5–12.5)
Platelets: 232 10*3/uL (ref 140–400)
RBC: 5.08 10*6/uL (ref 4.20–5.80)
RDW: 12.5 % (ref 11.0–15.0)
WBC: 5.3 10*3/uL (ref 3.8–10.8)

## 2019-03-18 LAB — LIPID PANEL
Cholesterol: 249 mg/dL — ABNORMAL HIGH (ref <200)
HDL: 59 mg/dL (ref >=40)
LDL Cholesterol (Calc): 152 mg/dL (calc) — ABNORMAL HIGH
Non-HDL Cholesterol (Calc): 190 mg/dL (calc) — ABNORMAL HIGH (ref <130)
Total CHOL/HDL Ratio: 4.2 (calc) (ref <5.0)
Triglycerides: 213 mg/dL — ABNORMAL HIGH (ref <150)

## 2019-03-18 LAB — HEMOGLOBIN A1C
Hgb A1c MFr Bld: 5.4 % of total Hgb (ref <5.7)
Mean Plasma Glucose: 108 (calc)
eAG (mmol/L): 6 (calc)

## 2019-03-18 LAB — T4: T4, Total: 8 ug/dL (ref 4.9–10.5)

## 2019-03-18 LAB — TSH: TSH: 1.18 mIU/L (ref 0.40–4.50)

## 2019-03-18 LAB — VITAMIN D 25 HYDROXY (VIT D DEFICIENCY, FRACTURES): Vit D, 25-Hydroxy: 29 ng/mL — ABNORMAL LOW (ref 30–100)

## 2019-03-18 LAB — T3, FREE: T3, Free: 3.5 pg/mL (ref 2.3–4.2)

## 2019-04-25 ENCOUNTER — Other Ambulatory Visit: Payer: Self-pay

## 2019-04-25 ENCOUNTER — Encounter: Payer: Self-pay | Admitting: Cardiovascular Disease

## 2019-04-25 ENCOUNTER — Ambulatory Visit (INDEPENDENT_AMBULATORY_CARE_PROVIDER_SITE_OTHER): Payer: BC Managed Care – PPO | Admitting: Cardiovascular Disease

## 2019-04-25 VITALS — BP 122/82 | HR 72 | Ht 71.0 in | Wt 161.8 lb

## 2019-04-25 DIAGNOSIS — Z8249 Family history of ischemic heart disease and other diseases of the circulatory system: Secondary | ICD-10-CM

## 2019-04-25 DIAGNOSIS — E785 Hyperlipidemia, unspecified: Secondary | ICD-10-CM

## 2019-04-25 NOTE — Progress Notes (Signed)
Cardiology Office Note:    Date:  04/25/2019   ID:  Jason Chen, DOB December 14, 1965, MRN [id number]  PCP:  Jason Albee, MD  Cardiologist:  Jason Chen  Electrophysiologist:  None   Referring MD: Jason Albee, MD   Chief Complaint  Patient presents with  . Hyperlipidemia     History of Present Illness:    Jason Chen is a 54 y.o. male with a hx of hyperlipidemia and a strong family history of premature coronary artery disease.  We are asked to see him today by Dr. Anastasio Chen for further evaluation and management of her of his hyperlipidemia and positive family history for CAD.  He was recently seen by Dr. Anastasio Chen for a physical exam.  His total cholesterol is 249.  His HDL is 59.  His LDL is 152.  Triglyceride level is 213. Vitamin D level is 29.  No cardiac complaints,  Plays golf and tennis, plays tennis 1-2 times a week.   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  Wine, beer occasionally  + fam hx of early CAD ,  Father died at age 84.  Mother also has CAD - recent CABG ,   elevated chol   Past Medical History:  Diagnosis Date  . BPH (benign prostatic hyperplasia) 03/17/2019  . HLD (hyperlipidemia) 03/17/2019  . Hyperlipemia   . Wears glasses     Past Surgical History:  Procedure Laterality Date  . KNEE ARTHROSCOPY W/ LATERAL RELEASE / MEDIAL IMBRICATION  2012   left  . MASS EXCISION Left 04/07/2014   Procedure: EXCISION MASS LEFT THUMB ;  Surgeon: Jason Cover, MD;  Location: [facility];  Service: Orthopedics;  Laterality: Left;  . MOLE REMOVAL      Current Medications: Current Meds  Medication Sig  . augmented betamethasone dipropionate (DIPROLENE-AF) 0.05 % cream Apply 1 application topically 2 (two) times daily.  Marland Kitchen desonide (DESOWEN) 0.05 % ointment Apply 1 application topically as needed.  . tadalafil (CIALIS) 5 MG tablet Take 5 mg by mouth  daily.  . tamsulosin (FLOMAX) 0.4 MG CAPS capsule Take 0.4 mg by mouth daily.     Allergies:   Patient has no known allergies.   Social History   Socioeconomic History  . Marital status: Married    Spouse name: Not on file  . Number of children: Not on file  . Years of education: Not on file  . Highest education level: Not on file  Occupational History  . Not on file  Tobacco Use  . Smoking status: Never Smoker  . Smokeless tobacco: Never Used  Substance and Sexual Activity  . Alcohol use: Yes    Alcohol/week: 5.0 standard drinks    Types: 5 Cans of beer per week  . Drug use: No  . Sexual activity: Yes    Comment: occ cigar  Other Topics Concern  . 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   Social Determinants of Health   Financial Resource Strain:   . Difficulty of Paying Living Expenses: Not on file  Food Insecurity:   . Worried About Charity fundraiser in the Last Year: Not on file  . Ran Out of Food in the Last Year: Not on file  Transportation Needs:   . Lack of Transportation (Medical): Not on file  . Lack  of Transportation (Non-Medical): Not on file  Physical Activity:   . Days of Exercise per Week: Not on file  . Minutes of Exercise per Session: Not on file  Stress:   . Feeling of Stress : Not on file  Social Connections:   . Frequency of Communication with Friends and Family: Not on file  . Frequency of Social Gatherings with Friends and Family: Not on file  . Attends Religious Services: Not on file  . Active Member of Clubs or Organizations: Not on file  . Attends Archivist Meetings: Not on file  . Marital Status: Not on file     Family History: The patient's family history includes Cancer in his mother; Heart disease in his father and mother; Hyperlipidemia in his brother, brother, and daughter.  ROS:   Please see the history of present illness.     All other systems reviewed and are  negative.  EKGs/Labs/Other Studies Reviewed:    The following studies were reviewed today: Notes from Dr. Anastasio Chen  Indicting hyperlipidemia   Recent Labs: 03/17/2019: ALT 36; BUN 19; Creat 0.94; Hemoglobin 15.3; Platelets 232; Potassium 5.0; Sodium 140; TSH 1.18  Recent Lipid Panel    Component Value Date/Time   CHOL 249 (H) 03/17/2019 1046   TRIG 213 (H) 03/17/2019 1046   HDL 59 03/17/2019 1046   CHOLHDL 4.2 03/17/2019 1046   LDLCALC 152 (H) 03/17/2019 1046    Physical Exam:    VS:  BP 122/82   Pulse 72   Ht 5\' 11"  (1.803 m)   Wt 161 lb 12.8 oz (73.4 kg)   SpO2 97%   BMI 22.57 kg/m     Wt Readings from Last 3 Encounters:  04/25/19 161 lb 12.8 oz (73.4 kg)  03/17/19 161 lb 6.4 oz (73.2 kg)  04/07/14 164 lb 8 oz (74.6 kg)     GEN:  Well nourished, well developed in no acute distress HEENT: Normal NECK: No JVD; No carotid bruits LYMPHATICS: No lymphadenopathy CARDIAC: RRR, no murmurs, rubs, gallops RESPIRATORY:  Clear to auscultation without rales, wheezing or rhonchi  ABDOMEN: Soft, non-tender, non-distended MUSCULOSKELETAL:  No edema; No deformity  SKIN: Warm and dry NEUROLOGIC:  Alert and oriented x 3 PSYCHIATRIC:  Normal affect   EKG:   April 25, 2019: Normal sinus rhythm at 72.  No ST or T wave changes.  ASSESSMENT:    1. Hyperlipidemia, unspecified hyperlipidemia type   2. Family history of premature CAD    PLAN:    In order of problems listed above:  1.  Hyperlipidemia: Jason Chen  presents for further evaluation management of his   hypercholesterolemia.  He has a strong family history of CAD.  Both parents had coronary artery disease.  Given his risk factors and his high cholesterol I think we should proceed with a coronary calcium score.  This will help [place] decide how aggressive we need to be with his lipid-lowering.  I have encouraged him to work hard on improving his diet.  He is not overweight but admits that he does not not eat a very low-fat  diet.  In addition, he does not exercise all that regularly.  He plays tennis perhaps once or twice a week.  Have encouraged him to participating cardio exercise for at least 3-4 times a week.  Medication Adjustments/Labs and Tests Ordered: Current medicines are reviewed at length with the patient today.  Concerns regarding medicines are outlined above.  Orders Placed This Encounter  Procedures  .  CT CARDIAC SCORING  . EKG 12-Lead   No orders of the defined types were placed in this encounter.    Patient Instructions  Medication Instructions:  Your physician recommends that you continue on your current medications as directed. Please refer to the Current Medication list given to you today.  *If you need a refill on your cardiac medications before your next appointment, please call your pharmacy*  Lab Work: None Ordered If you have labs (blood work) drawn today and your tests are completely normal, you will receive your results only by: Marland Kitchen MyChart Message (if you have MyChart) OR . A paper copy in the mail If you have any lab test that is abnormal or we need to change your treatment, we will call you to review the results.   Testing/Procedures: Non-Cardiac CT scanning, (CAT scanning), for calcium scoring is a noninvasive, special x-ray that produces cross-sectional images of the body using x-rays and a computer. CT scans help physicians diagnose and treat medical conditions. For some CT exams, a contrast material is used to enhance visibility in the area of the body being studied. CT scans provide greater clarity and reveal more details than regular x-ray exams.   Follow-Up: At [facility], you and your health needs are our priority.  As part of our continuing mission to provide you with exceptional heart care, we have created designated Provider Care Teams.  These Care Teams include your primary Cardiologist (physician) and Advanced Practice Providers (APPs -  Physician Assistants  and Nurse Practitioners) who all work together to provide you with the care you need, when you need it.  Your next appointment:   6 month(s)  The format for your next appointment:   In Person  Provider:   You may see Mertie Moores, MD or one of the following Advanced Practice Providers on your designated Care Team:    Richardson Dopp, PA-C  [place], [place]  Daune Perch, [place]       Signed, Mertie Moores, MD  04/25/2019 9:32 AM    [place]

## 2019-04-25 NOTE — Patient Instructions (Signed)
Medication Instructions:  Your physician recommends that you continue on your current medications as directed. Please refer to the Current Medication list given to you today.  *If you need a refill on your cardiac medications before your next appointment, please call your pharmacy*  Lab Work: None Ordered If you have labs (blood work) drawn today and your tests are completely normal, you will receive your results only by: Marland Kitchen MyChart Message (if you have MyChart) OR . A paper copy in the mail If you have any lab test that is abnormal or we need to change your treatment, we will call you to review the results.   Testing/Procedures: Non-Cardiac CT scanning, (CAT scanning), for calcium scoring is a noninvasive, special x-ray that produces cross-sectional images of the body using x-rays and a computer. CT scans help physicians diagnose and treat medical conditions. For some CT exams, a contrast material is used to enhance visibility in the area of the body being studied. CT scans provide greater clarity and reveal more details than regular x-ray exams.   Follow-Up: At [facility], you and your health needs are our priority.  As part of our continuing mission to provide you with exceptional heart care, we have created designated Provider Care Teams.  These Care Teams include your primary Cardiologist (physician) and Advanced Practice Providers (APPs -  Physician Assistants and Nurse Practitioners) who all work together to provide you with the care you need, when you need it.  Your next appointment:   6 month(s)  The format for your next appointment:   In Person  Provider:   You may see Mertie Moores, MD or one of the following Advanced Practice Providers on your designated Care Team:    Richardson Dopp, PA-C  [place], [place]  Daune Perch, [place]

## 2019-04-29 ENCOUNTER — Inpatient Hospital Stay: Admission: RE | Admit: 2019-04-29 | Payer: BC Managed Care – PPO | Source: Ambulatory Visit

## 2019-05-15 ENCOUNTER — Telehealth (INDEPENDENT_AMBULATORY_CARE_PROVIDER_SITE_OTHER): Payer: Self-pay | Admitting: Internal Medicine

## 2019-05-15 DIAGNOSIS — N401 Enlarged prostate with lower urinary tract symptoms: Secondary | ICD-10-CM

## 2019-05-15 DIAGNOSIS — N3943 Post-void dribbling: Secondary | ICD-10-CM

## 2019-05-15 MED ORDER — TADALAFIL 5 MG PO TABS: 5.0000 mg | ORAL_TABLET | Freq: Every day | ORAL | 3 refills | Status: DC

## 2019-05-15 NOTE — Telephone Encounter (Signed)
Refill sent.

## 2019-05-20 ENCOUNTER — Encounter (INDEPENDENT_AMBULATORY_CARE_PROVIDER_SITE_OTHER): Payer: Self-pay | Admitting: Internal Medicine

## 2019-05-20 ENCOUNTER — Other Ambulatory Visit: Payer: Self-pay

## 2019-05-20 ENCOUNTER — Ambulatory Visit (INDEPENDENT_AMBULATORY_CARE_PROVIDER_SITE_OTHER)
Admission: RE | Admit: 2019-05-20 | Discharge: 2019-05-20 | Disposition: A | Discharge status: Home / Self Care | Payer: Self-pay | Source: Ambulatory Visit | Attending: Cardiovascular Disease | Admitting: Cardiovascular Disease

## 2019-05-20 DIAGNOSIS — E785 Hyperlipidemia, unspecified: Secondary | ICD-10-CM

## 2019-05-20 DIAGNOSIS — Z8249 Family history of ischemic heart disease and other diseases of the circulatory system: Secondary | ICD-10-CM

## 2019-05-22 ENCOUNTER — Telehealth: Payer: Self-pay | Admitting: Nurse Practitioner

## 2019-05-22 DIAGNOSIS — R931 Abnormal findings on diagnostic imaging of heart and coronary circulation: Secondary | ICD-10-CM

## 2019-05-22 DIAGNOSIS — Z8249 Family history of ischemic heart disease and other diseases of the circulatory system: Secondary | ICD-10-CM

## 2019-05-22 DIAGNOSIS — E785 Hyperlipidemia, unspecified: Secondary | ICD-10-CM

## 2019-05-22 MED ORDER — ROSUVASTATIN CALCIUM 10 MG PO TABS: 10.0000 mg | ORAL_TABLET | Freq: Every day | ORAL | 3 refills | Status: DC

## 2019-05-22 NOTE — Telephone Encounter (Signed)
-----   Message from Thayer Headings, MD sent at 05/21/2019  1:08 PM EST ----- Coronary calcium score of 117 . This was percentile 44 for age and Gender matched control He has a strong family hx of CAD.  I would recommend that he start rosuvastatin 10 mg a day .  Check lipids, liver enz, bmp in 3 months

## 2019-05-22 NOTE — Telephone Encounter (Signed)
Reviewed results and plan of care with patient who verbalized understanding and agreement. Questions were answered to his satisfaction. He is scheduled for repeat lab appointment on 08/20/19 and agrees to call back prior to that time with any questions or concerns. He thanked me for the call.

## 2019-06-16 ENCOUNTER — Ambulatory Visit: Payer: BC Managed Care – PPO | Attending: Internal Medicine

## 2019-06-16 DIAGNOSIS — Z23 Encounter for immunization: Secondary | ICD-10-CM

## 2019-06-16 NOTE — Progress Notes (Signed)
   Covid-19 Vaccination Clinic  Name:  Jason Chen    MRN: [id number] DOB: 1965-09-01  06/16/2019  [name] was observed post Covid-19 immunization for 15 minutes without incident. He was provided with Vaccine Information Sheet and instruction to access the V-Safe system.   [name] was instructed to call 911 with any severe reactions post vaccine: Marland Kitchen Difficulty breathing  . Swelling of face and throat  . A fast heartbeat  . A bad rash all over body  . Dizziness and weakness   Immunizations Administered    Name Date Dose VIS Date Route   Pfizer COVID-19 Vaccine 06/16/2019  5:57 PM 0.3 mL 03/21/2019 Intramuscular   Manufacturer: [place]   Lot: [id number]   [place]: [id number]

## 2019-07-10 ENCOUNTER — Telehealth (INDEPENDENT_AMBULATORY_CARE_PROVIDER_SITE_OTHER): Payer: Self-pay

## 2019-07-10 NOTE — Telephone Encounter (Signed)
Return call, left a voicemail.

## 2019-07-10 NOTE — Telephone Encounter (Signed)
Called left voicemail to ask concerns. Son tested POS for Covid. Pt & wife has had 1st injection. Is it safe to be around son?Will Dr Darnell Level give me a call back on concerns.

## 2019-07-10 NOTE — Telephone Encounter (Signed)
I spoke to [facility] yesterday.  Let him know that I will call him later on today for the time being, even though he has had the first dose of COVID-19 vaccination, he should keep it away from his son.

## 2019-07-14 ENCOUNTER — Ambulatory Visit: Payer: BC Managed Care – PPO | Attending: Internal Medicine

## 2019-07-14 DIAGNOSIS — Z23 Encounter for immunization: Secondary | ICD-10-CM

## 2019-07-14 NOTE — Progress Notes (Signed)
   Covid-19 Vaccination Clinic  Name:  MANDEEP KOSTAS    MRN: [id number] DOB: [birth year]  07/14/2019  Mr. Ghattas was observed post Covid-19 immunization for 15 minutes without incident. He was provided with Vaccine Information Sheet and instruction to access the V-Safe system.   Mr. Cantey was instructed to call 911 with any severe reactions post vaccine: Marland Kitchen Difficulty breathing  . Swelling of face and throat  . A fast heartbeat  . A bad rash all over body  . Dizziness and weakness   Immunizations Administered    Name Date Dose VIS Date Route   Pfizer COVID-19 Vaccine 07/14/2019 10:11 AM 0.3 mL 03/21/2019 Intramuscular   Manufacturer: [place]   Lot: [id number]   [place]: [id number]

## 2019-08-20 ENCOUNTER — Other Ambulatory Visit: Payer: BC Managed Care – PPO

## 2019-09-18 ENCOUNTER — Other Ambulatory Visit: Payer: BC Managed Care – PPO

## 2019-09-24 ENCOUNTER — Other Ambulatory Visit: Payer: Self-pay

## 2019-09-24 ENCOUNTER — Other Ambulatory Visit: Payer: BC Managed Care – PPO | Admitting: *Deleted

## 2019-09-24 DIAGNOSIS — E785 Hyperlipidemia, unspecified: Secondary | ICD-10-CM

## 2019-09-24 DIAGNOSIS — Z8249 Family history of ischemic heart disease and other diseases of the circulatory system: Secondary | ICD-10-CM

## 2019-09-24 DIAGNOSIS — R931 Abnormal findings on diagnostic imaging of heart and coronary circulation: Secondary | ICD-10-CM | POA: Diagnosis not present

## 2019-09-24 LAB — HEPATIC FUNCTION PANEL
ALT: 42 IU/L (ref 0–44)
AST: 33 IU/L (ref 0–40)
Albumin: 4.4 g/dL (ref 3.8–4.9)
Alkaline Phosphatase: 88 IU/L (ref 48–121)
Bilirubin Total: 0.4 mg/dL (ref 0.0–1.2)
Bilirubin, Direct: 0.11 mg/dL (ref 0.00–0.40)
Total Protein: 6.5 g/dL (ref 6.0–8.5)

## 2019-09-24 LAB — BASIC METABOLIC PANEL
BUN/Creatinine Ratio: 20 (ref 9–20)
BUN: 18 mg/dL (ref 6–24)
CO2: 24 mmol/L (ref 20–29)
Calcium: 9 mg/dL (ref 8.7–10.2)
Chloride: 102 mmol/L (ref 96–106)
Creatinine, Ser: 0.92 mg/dL (ref 0.76–1.27)
GFR calc Af Amer: 109 mL/min/{1.73_m2} (ref >59)
GFR calc non Af Amer: 94 mL/min/{1.73_m2} (ref >59)
Glucose: 92 mg/dL (ref 65–99)
Potassium: 4.2 mmol/L (ref 3.5–5.2)
Sodium: 142 mmol/L (ref 134–144)

## 2019-09-24 LAB — LIPID PANEL
Chol/HDL Ratio: 2.7 ratio (ref 0.0–5.0)
Cholesterol, Total: 164 mg/dL (ref 100–199)
HDL: 60 mg/dL (ref >39)
LDL Chol Calc (NIH): 86 mg/dL (ref 0–99)
Triglycerides: 98 mg/dL (ref 0–149)
VLDL Cholesterol Cal: 18 mg/dL (ref 5–40)

## 2019-09-25 ENCOUNTER — Telehealth: Payer: Self-pay | Admitting: Nurse Practitioner

## 2019-09-25 DIAGNOSIS — Z8249 Family history of ischemic heart disease and other diseases of the circulatory system: Secondary | ICD-10-CM

## 2019-09-25 DIAGNOSIS — R931 Abnormal findings on diagnostic imaging of heart and coronary circulation: Secondary | ICD-10-CM

## 2019-09-25 DIAGNOSIS — E785 Hyperlipidemia, unspecified: Secondary | ICD-10-CM

## 2019-09-25 MED ORDER — ROSUVASTATIN CALCIUM 10 MG PO TABS: 10.0000 mg | ORAL_TABLET | Freq: Every day | ORAL | 3 refills | Status: DC

## 2019-09-25 NOTE — Telephone Encounter (Signed)
-----   Message from Fay Records, MD sent at 09/25/2019 12:19 PM EDT ----- LIpids:  LDL 86   Given that he has coronary artery plaquing the goal is 70 or below .   I would recomm increasing Crestor to 20 mg   F/U lipdis in 8 wks  Watch saturated fats  Liver funciton, electrolytes and kidney function are normal

## 2019-09-25 NOTE — Telephone Encounter (Signed)
Results reviewed with patient. He is happy about his improvements and states he has been taking Crestor 10 mg about 5 days per week. He would like to try to be more diligent to take it 7 days per week before increasing dose. He is interested in seeing a nutritionist and I offered to see a referral but I do not know if he will qualify due to no obesity or DM. We discussed the mediterranean diet and he agrees to read about it online. I scheduled him for recheck of labs in 2 1/2 months and advised him to call back sooner with questions or concerns. He thanked me for the call.

## 2019-10-07 DIAGNOSIS — L308 Other specified dermatitis: Secondary | ICD-10-CM | POA: Diagnosis not present

## 2019-10-07 DIAGNOSIS — L0889 Other specified local infections of the skin and subcutaneous tissue: Secondary | ICD-10-CM | POA: Diagnosis not present

## 2019-10-07 DIAGNOSIS — L011 Impetiginization of other dermatoses: Secondary | ICD-10-CM | POA: Diagnosis not present

## 2019-12-03 ENCOUNTER — Other Ambulatory Visit: Payer: BC Managed Care – PPO

## 2020-01-08 NOTE — Progress Notes (Signed)
Medical Nutrition Therapy Appt start time: 1000 end time: 1100 (1 hour) Wife Jason Chen Patient has completed 2nd dose of COVID-19 vaccine 07/14/19.  Assessment:  Primary concerns today: lipids management.  Jason Chen was accompanied by his wife at today's appt.  He has a h/o HLD for which he takes Crestor.  [name]'s wife is especially concerned re. Kosei's family hx of CAD as well as DM2.  He is also interested in learning how to make better food choices.   Lab Results  Component Value Date   CHOL 164 09/24/2019   HDL 60 09/24/2019   LDLCALC 86 09/24/2019   TRIG 98 09/24/2019   CHOLHDL 2.7 09/24/2019   [place] snacks frequently, sometimes on fruit or cherry tomatoes, but also on packaged snack foods.  They have gotten into a routine of eating out several times a week, and not doing much home food prep.    Learning Readiness: Ready  Usual eating pattern: 2-3 meals and 6-8 snacks per day. Frequent foods and beverages: daily: 2 c coffee w/ crm and sugar, 2-6 wine or beer, water; BLT, fries, salads, bread, pasta, almond milk.  Eats restaurant/takeout foods frequently.   Avoided foods: milk (feels it's not good for him), broccoli (disliked), most fish; trying to reduce sugary foods and bvgs (has stopped drinking soda).   Usual physical activity: plays golf (walks mostly) 2-3 X wk; tennis 2 X mo in winter; "seldom sits still." Sleep: Estimates he gets ~7 hrs/night.    24-hr recall (could not recall all foods yesterday):  (Up at 6 AM; 1 c coffee, 1 tbsp cream, 1 tsp sugar) B (7 AM)-   1 1/2 c homemade granola, 1/2 c almond milk, 1/2 c coffee, cream Snk ( AM)-   1 energy bar L ( PM)-  1 medium salad w/ olives, parmesan, water Snk ( PM)-  ? snack ? D ( PM)-  1 salad, 3 slc pepperoni/ham pizza, 12 oz beer Snk ( PM)-  12 oz beer, 1-2 c roasted, salted garbanzos Typical day? Yes.     Nutritional Diagnosis:  NB-1.1 Food and nutrition-related knowledge deficit As related to what constitutes  nutritional balance and why it matters.  As evidenced by erratic eating pattern and frequently inadequate or imbalanced meals.  Handouts given during visit include:  After-Visit Summary (AVS)  Demonstrated degree of understanding via:  Teach Back  Barriers to learning/adherence to lifestyle change: Longstanding eating behaviors and learned taste preferences.   Monitoring/Evaluation:  Dietary intake, exercise, and body weight prn.

## 2020-01-12 ENCOUNTER — Other Ambulatory Visit: Payer: Self-pay

## 2020-01-12 ENCOUNTER — Ambulatory Visit (INDEPENDENT_AMBULATORY_CARE_PROVIDER_SITE_OTHER): Payer: BC Managed Care – PPO | Admitting: Family Medicine

## 2020-01-12 ENCOUNTER — Encounter: Payer: Self-pay | Admitting: Family Medicine

## 2020-01-12 DIAGNOSIS — E782 Mixed hyperlipidemia: Secondary | ICD-10-CM

## 2020-01-12 DIAGNOSIS — Z8249 Family history of ischemic heart disease and other diseases of the circulatory system: Secondary | ICD-10-CM

## 2020-01-12 NOTE — Patient Instructions (Signed)
1. Eat at least 3 REAL meals and 2(+) snacks per day.  Eat breakfast within one hour of getting up.  Aim for no more than 5 hours between eating.   A REAL meal includes at least some protein, some starch, and vegetables and/or fruit.   (OR: Would you serve this to a guest in your home, and call it a meal?)  Fried foods:  Be mindful as to how much and how often.  Also, if you have something fried at a meal, make sure the rest of the meal is relatively low-fat.  You may want to document each time you have fried food, and track your weekly number over time.    Soy milk is the only dairy alternative that provides protein.    TASTE PREFERENCES ARE LEARNED.  This means it will get easier to choose foods you know are good for you if you are exposed to them enough.    Alcohol intake:  Be selective rather than habitual.

## 2020-01-15 DIAGNOSIS — N401 Enlarged prostate with lower urinary tract symptoms: Secondary | ICD-10-CM | POA: Diagnosis not present

## 2020-01-15 DIAGNOSIS — R350 Frequency of micturition: Secondary | ICD-10-CM | POA: Diagnosis not present

## 2020-01-26 DIAGNOSIS — N401 Enlarged prostate with lower urinary tract symptoms: Secondary | ICD-10-CM | POA: Diagnosis not present

## 2020-01-26 DIAGNOSIS — R972 Elevated prostate specific antigen [PSA]: Secondary | ICD-10-CM | POA: Diagnosis not present

## 2020-01-26 DIAGNOSIS — R350 Frequency of micturition: Secondary | ICD-10-CM | POA: Diagnosis not present

## 2020-01-26 DIAGNOSIS — E291 Testicular hypofunction: Secondary | ICD-10-CM | POA: Diagnosis not present

## 2020-03-10 ENCOUNTER — Telehealth (INDEPENDENT_AMBULATORY_CARE_PROVIDER_SITE_OTHER): Payer: BC Managed Care – PPO | Admitting: Internal Medicine

## 2020-03-10 ENCOUNTER — Encounter (INDEPENDENT_AMBULATORY_CARE_PROVIDER_SITE_OTHER): Payer: Self-pay | Admitting: Internal Medicine

## 2020-03-10 DIAGNOSIS — J209 Acute bronchitis, unspecified: Secondary | ICD-10-CM | POA: Diagnosis not present

## 2020-03-10 MED ORDER — AZITHROMYCIN 250 MG PO TABS: ORAL_TABLET | ORAL | 0 refills | Status: DC

## 2020-03-10 NOTE — Progress Notes (Signed)
Metrics: Intervention Frequency ACO  Documented Smoking Status Yearly  Screened one or more times in 24 months  Cessation Counseling or  Active cessation medication Past 24 months  Past 24 months   Guideline developer: UpToDate (See UpToDate for funding source) Date Released: 2014       Wellness Office Visit  Subjective:  Patient ID: Jason Chen, male    DOB: 13-Sep-1965  Age: 54 y.o. MRN: [id number]  CC: This is an audio telemedicine visit with the permission of the patient who is at work and I am in my office.  I used 2 identifiers to identify the patient. Chills, productive cough, wheezing. HPI  The patient symptoms started 24 hours ago.  The cough is productive of yellow sputum.  He has some mild wheezing.  His wife has similar symptoms.  He denies any fever.  He denies any body aches or loss of taste.  He is fully vaccinated against COVID-19 disease. Past Medical History:  Diagnosis Date  . BPH (benign prostatic hyperplasia) 03/17/2019  . HLD (hyperlipidemia) 03/17/2019  . Hyperlipemia   . Wears glasses    Past Surgical History:  Procedure Laterality Date  . KNEE ARTHROSCOPY W/ LATERAL RELEASE / MEDIAL IMBRICATION  2012   left  . MASS EXCISION Left 04/07/2014   Procedure: EXCISION MASS LEFT THUMB ;  Surgeon: Leanora Cover, MD;  Location: [facility];  Service: Orthopedics;  Laterality: Left;  . MOLE REMOVAL       Family History  Problem Relation Age of Onset  . Heart disease Mother   . Cancer Mother   . Heart disease Father   . Hyperlipidemia Brother   . Hyperlipidemia Daughter   . 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   Social History   Tobacco Use  . Smoking status: Never Smoker  . Smokeless tobacco: Never Used  Substance Use Topics  . Alcohol use: Yes    Alcohol/week: 5.0 standard drinks    Types: 5 Cans of beer per week    Current Meds  Medication  Sig  . augmented betamethasone dipropionate (DIPROLENE-AF) 0.05 % cream Apply 1 application topically 2 (two) times daily.  Marland Kitchen desonide (DESOWEN) 0.05 % ointment Apply 1 application topically as needed.  . rosuvastatin (CRESTOR) 10 MG tablet Take 1 tablet (10 mg total) by mouth daily.  . tadalafil (CIALIS) 5 MG tablet Take 1 tablet (5 mg total) by mouth daily.  . tamsulosin (FLOMAX) 0.4 MG CAPS capsule Take 0.4 mg by mouth daily.      No flowsheet data found.   Objective:   Today's Vitals: There were no vitals taken for this visit. Vitals with BMI 03/10/2020 01/12/2020 04/25/2019  Height (No Data) 5\' 9"  5\' 11"   Weight (No Data) 160 lbs 13 oz 161 lbs 13 oz  BMI - 73.71 06.26  Systolic (No Data) - 948  Diastolic (No Data) - 82  Pulse - - 72     Physical Exam  He is alert and orientated and does not appear to be dyspneic at rest.     Assessment   1. Acute bronchitis, unspecified organism       Tests ordered No orders of the defined types were placed in this encounter.    Plan: 1. I will treat him empirically for acute bronchitis with Zithromax.  If his symptoms prolonged and he does not improve, in about a couple of days  I would recommend he gets COVID-19 test.  He will let me know. 2. This phone call lasted 5 minutes and 50 seconds   Meds ordered this encounter  Medications  . azithromycin (ZITHROMAX) 250 MG tablet    Sig: Take 2 tablets the first day and then 1 tablet every day for the next 4 days    Dispense:  6 tablet    Refill:  0    Laura Caldas Luther Parody, MD

## 2020-03-16 ENCOUNTER — Encounter (INDEPENDENT_AMBULATORY_CARE_PROVIDER_SITE_OTHER): Payer: BC Managed Care – PPO | Admitting: Internal Medicine

## 2020-04-14 ENCOUNTER — Encounter: Payer: Self-pay | Admitting: Cardiovascular Disease

## 2020-04-14 NOTE — Progress Notes (Signed)
Cardiology Office Note:    Date:  04/15/2020   ID:  Charlie Pitter, DOB 08/22/1965, MRN [id number]  PCP:  Wilson Singer, MD  Cardiologist:  Brazos Sandoval  Electrophysiologist:  None   Referring MD: Wilson Singer, MD   Chief Complaint  Patient presents with  . Hyperlipidemia     Previous notes:   GABRIELLA GUILE is a 55 y.o. male with a hx of hyperlipidemia and a strong family history of premature coronary artery disease.  We are asked to see him today by Dr. Karilyn Cota for further evaluation and management of her of his hyperlipidemia and positive family history for CAD.  He was recently seen by Dr. Karilyn Cota for a physical exam.  His total cholesterol is 249.  His HDL is 59.  His LDL is 152.  Triglyceride level is 213. Vitamin D level is 29.  No cardiac complaints,  Plays golf and tennis, plays tennis 1-2 times a week.   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  Wine, beer occasionally  + fam hx of early CAD ,  Father died at age 86.  Mother also has CAD - recent CABG ,   elevated chol   Jan. 6, 2022: [place] is seen today for follow up for his hyperlipidemia and strong family hx of CAD  No CP or dyspnea  Is on rosuvastatin 10 mg a day for hyperlipid Feb. 2021 - Coronary calcium score of 117 . This was percentile 31 for age and sex matched control.   Past Medical History:  Diagnosis Date  . BPH (benign prostatic hyperplasia) 03/17/2019  . HLD (hyperlipidemia) 03/17/2019  . Hyperlipemia   . Wears glasses     Past Surgical History:  Procedure Laterality Date  . KNEE ARTHROSCOPY W/ LATERAL RELEASE / MEDIAL IMBRICATION  2012   left  . MASS EXCISION Left 04/07/2014   Procedure: EXCISION MASS LEFT THUMB ;  Surgeon: Betha Loa, MD;  Location: [facility];  Service: Orthopedics;  Laterality: Left;  . MOLE REMOVAL      Current Medications: Current Meds  Medication Sig   . augmented betamethasone dipropionate (DIPROLENE-AF) 0.05 % cream Apply 1 application topically 2 (two) times daily.  . rosuvastatin (CRESTOR) 20 MG tablet Take 1 tablet (20 mg total) by mouth daily.  . tadalafil (CIALIS) 5 MG tablet Take 1 tablet (5 mg total) by mouth daily.  . tamsulosin (FLOMAX) 0.4 MG CAPS capsule Take 0.4 mg by mouth as needed.  . [DISCONTINUED] azithromycin (ZITHROMAX) 250 MG tablet Take 2 tablets the first day and then 1 tablet every day for the next 4 days  . [DISCONTINUED] rosuvastatin (CRESTOR) 10 MG tablet Take 1 tablet (10 mg total) by mouth daily.     Allergies:   Patient has no known allergies.   Social History   Socioeconomic History  . Marital status: Married    Spouse name: Not on file  . Number of children: Not on file  . Years of education: Not on file  . Highest education level: Not on file  Occupational History  . Not on file  Tobacco Use  . Smoking status: Never Smoker  . Smokeless tobacco: Never Used  Substance and Sexual Activity  . Alcohol use: Yes    Alcohol/week: 5.0 standard drinks    Types: 5 Cans of beer per week  . Drug use: No  .  Sexual activity: Yes    Comment: occ cigar  Other Topics Concern  . 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   Social Determinants of Health   Financial Resource Strain: Not on file  Food Insecurity: Not on file  Transportation Needs: Not on file  Physical Activity: Not on file  Stress: Not on file  Social Connections: Not on file     Family History: The patient's family history includes Cancer in his mother; Heart disease in his father and mother; Hyperlipidemia in his brother, brother, and daughter.  ROS:   Please see the history of present illness.     All other systems reviewed and are negative.  EKGs/Labs/Other Studies Reviewed:    The following studies were reviewed today: Notes from Dr. Anastasio Champion  Indicting  hyperlipidemia   Recent Labs: 09/24/2019: ALT 42; BUN 18; Creatinine, Ser 0.92; Potassium 4.2; Sodium 142  Recent Lipid Panel    Component Value Date/Time   CHOL 164 09/24/2019 0724   TRIG 98 09/24/2019 0724   HDL 60 09/24/2019 0724   CHOLHDL 2.7 09/24/2019 0724   CHOLHDL 4.2 03/17/2019 1046   LDLCALC 86 09/24/2019 0724   LDLCALC 152 (H) 03/17/2019 1046    Physical Exam:    Physical Exam: Blood pressure 116/72, pulse 65, height 5\' 11"  (1.803 m), weight 165 lb (74.8 kg), SpO2 98 %.  GEN:  Well nourished, well developed in no acute distress HEENT: Normal NECK: No JVD; No carotid bruits LYMPHATICS: No lymphadenopathy CARDIAC: RRR , no murmurs, rubs, gallops RESPIRATORY:  Clear to auscultation without rales, wheezing or rhonchi  ABDOMEN: Soft, non-tender, non-distended MUSCULOSKELETAL:  No edema; No deformity  SKIN: Warm and dry NEUROLOGIC:  Alert and oriented x 3    EKG:      ASSESSMENT:    1. Elevated coronary artery calcium score   2. Hyperlipidemia, unspecified hyperlipidemia type   3. Family history of premature CAD    PLAN:      1.  Hyperlipidemia: his LDL has improved from the 150 range to 86 on Rosuvastatin 10 He has a strong family hx of CAD and he has coronary artery calcifications. I would like his LDL to be 50-60 range Will increase his Rosuvastatin to 20 mg a day Recheck labs in 3 months I will see him in 4 months.   I would have a low threshold to refer him to the lipid clinic for consideration for PCSK9 inhibitor if he is not to goal on the rosuvastatin 20   2,.  Coronary artery calcifications:  No angina     Medication Adjustments/Labs and Tests Ordered: Current medicines are reviewed at length with the patient today.  Concerns regarding medicines are outlined above.  Orders Placed This Encounter  Procedures  . Lipid Profile  . Basic Metabolic Panel (BMET)  . Hepatic function panel  . EKG 12-Lead   Meds ordered this encounter   Medications  . rosuvastatin (CRESTOR) 20 MG tablet    Sig: Take 1 tablet (20 mg total) by mouth daily.    Dispense:  90 tablet    Refill:  3     There are no Patient Instructions on file for this visit.   Signed, Mertie Moores, MD  04/15/2020 4:37 PM    [place]

## 2020-04-15 ENCOUNTER — Other Ambulatory Visit: Payer: Self-pay

## 2020-04-15 ENCOUNTER — Encounter: Payer: Self-pay | Admitting: Cardiovascular Disease

## 2020-04-15 ENCOUNTER — Ambulatory Visit (INDEPENDENT_AMBULATORY_CARE_PROVIDER_SITE_OTHER): Payer: BC Managed Care – PPO | Admitting: Cardiovascular Disease

## 2020-04-15 VITALS — BP 116/72 | HR 65 | Ht 71.0 in | Wt 165.0 lb

## 2020-04-15 DIAGNOSIS — I2510 Atherosclerotic heart disease of native coronary artery without angina pectoris: Secondary | ICD-10-CM

## 2020-04-15 DIAGNOSIS — E785 Hyperlipidemia, unspecified: Secondary | ICD-10-CM

## 2020-04-15 DIAGNOSIS — Z8249 Family history of ischemic heart disease and other diseases of the circulatory system: Secondary | ICD-10-CM

## 2020-04-15 DIAGNOSIS — R931 Abnormal findings on diagnostic imaging of heart and coronary circulation: Secondary | ICD-10-CM | POA: Diagnosis not present

## 2020-04-15 DIAGNOSIS — I2584 Coronary atherosclerosis due to calcified coronary lesion: Secondary | ICD-10-CM

## 2020-04-15 MED ORDER — ROSUVASTATIN CALCIUM 20 MG PO TABS
20.0000 mg | ORAL_TABLET | Freq: Every day | ORAL | 3 refills | Status: DC
Start: 2020-04-15 — End: 2021-04-25

## 2020-04-15 NOTE — Patient Instructions (Signed)
Medication Instructions:  Your physician has recommended you make the following change in your medication:  INCREASE Rosuvastatin to 20 mg once daily  *If you need a refill on your cardiac medications before your next appointment, please call your pharmacy*   Lab Work: Your physician recommends that you return for lab work in: 3 months on Friday April 29 at 7:30 am You will need to FAST for this appointment - nothing to eat or drink after midnight the night before except water.  If you have labs (blood work) drawn today and your tests are completely normal, you will receive your results only by: [place] MyChart Message (if you have MyChart) OR . A paper copy in the mail If you have any lab test that is abnormal or we need to change your treatment, we will call you to review the results.   Testing/Procedures: None Ordered    Follow-Up: At [facility], you and your health needs are our priority.  As part of our continuing mission to provide you with exceptional heart care, we have created designated Provider Care Teams.  These Care Teams include your primary Cardiologist (physician) and Advanced Practice Providers (APPs -  Physician Assistants and Nurse Practitioners) who all work together to provide you with the care you need, when you need it.   Your next appointment:   4 month(s) on Friday May 6 at 8:00 am  The format for your next appointment:   In Person  Provider:   Kristeen Miss, MD

## 2020-05-04 ENCOUNTER — Other Ambulatory Visit (INDEPENDENT_AMBULATORY_CARE_PROVIDER_SITE_OTHER): Payer: Self-pay | Admitting: Internal Medicine

## 2020-05-04 DIAGNOSIS — N3943 Post-void dribbling: Secondary | ICD-10-CM

## 2020-05-04 DIAGNOSIS — N401 Enlarged prostate with lower urinary tract symptoms: Secondary | ICD-10-CM

## 2020-05-04 MED ORDER — TADALAFIL 5 MG PO TABS: 5.0000 mg | ORAL_TABLET | Freq: Every day | ORAL | 3 refills | Status: AC

## 2020-07-27 ENCOUNTER — Other Ambulatory Visit: Payer: BC Managed Care – PPO

## 2020-08-03 ENCOUNTER — Other Ambulatory Visit: Payer: BC Managed Care – PPO

## 2020-08-06 ENCOUNTER — Other Ambulatory Visit: Payer: BC Managed Care – PPO

## 2020-08-11 ENCOUNTER — Ambulatory Visit: Payer: No Typology Code available for payment source | Admitting: Cardiovascular Disease

## 2020-08-13 ENCOUNTER — Ambulatory Visit: Payer: BC Managed Care – PPO | Admitting: Cardiovascular Disease

## 2020-09-08 NOTE — Progress Notes (Deleted)
Cardiology Office Note    Date:  09/08/2020   ID:  Jason Chen, DOB Aug 19, 1965, MRN [id number]   PCP:  Doree Albee, MD   [place]  Cardiologist:  Mertie Moores, MD *** Advanced Practice Provider:  No care team member to display Electrophysiologist:  None   [phone]   No chief complaint on file.   History of Present Illness:  Jason Chen is a 55 y.o. male with a hx of hyperlipidemia and a strong family history of premature coronary artery disease, calcium score 117 05/2019 which was 83% of age and sex matched control on rosuvastatin.  LDL was 86 so rosuvastatin increased to 20 mg daily.     Past Medical History:  Diagnosis Date  . BPH (benign prostatic hyperplasia) 03/17/2019  . HLD (hyperlipidemia) 03/17/2019  . Hyperlipemia   . Wears glasses     Past Surgical History:  Procedure Laterality Date  . KNEE ARTHROSCOPY W/ LATERAL RELEASE / MEDIAL IMBRICATION  2012   left  . MASS EXCISION Left 04/07/2014   Procedure: EXCISION MASS LEFT THUMB ;  Surgeon: Leanora Cover, MD;  Location: [facility];  Service: Orthopedics;  Laterality: Left;  . MOLE REMOVAL      Current Medications: No outpatient medications have been marked as taking for the 09/22/20 encounter (Appointment) with Imogene Burn, PA-C.     Allergies:   Patient has no known allergies.   Social History   Socioeconomic History  . Marital status: Married    Spouse name: Not on file  . Number of children: Not on file  . Years of education: Not on file  . Highest education level: Not on file  Occupational History  . Not on file  Tobacco Use  . Smoking status: Never Smoker  . Smokeless tobacco: Never Used  Substance and Sexual Activity  . Alcohol use: Yes    Alcohol/week: 5.0 standard drinks    Types: 5 Cans of beer per week  . Drug use: No  . Sexual activity: Yes    Comment: occ cigar  Other Topics Concern  . 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   Social Determinants of Health   Financial Resource Strain: Not on file  Food Insecurity: Not on file  Transportation Needs: Not on file  Physical Activity: Not on file  Stress: Not on file  Social Connections: Not on file     Family History:  The patient's ***family history includes Cancer in his mother; Heart disease in his father and mother; Hyperlipidemia in his brother, brother, and daughter.   ROS:   Please see the history of present illness.    ROS All other systems reviewed and are negative.   PHYSICAL EXAM:   VS:  There were no vitals taken for this visit.  Physical Exam  GEN: Well nourished, well developed, in no acute distress  HEENT: normal  Neck: no JVD, carotid bruits, or masses Cardiac:RRR; no murmurs, rubs, or gallops  Respiratory:  clear to auscultation bilaterally, normal work of breathing GI: soft, nontender, nondistended, + BS Ext: without cyanosis, clubbing, or edema, Good distal pulses bilaterally MS: no deformity or atrophy  Skin: warm and dry, no rash Neuro:  Alert and Oriented x 3, Strength and sensation are intact Psych: euthymic mood, full affect  Wt Readings from Last 3 Encounters:  04/15/20 165 lb (74.8 kg)  01/12/20 160 lb 12.8 oz (  72.9 kg)  04/25/19 161 lb 12.8 oz (73.4 kg)      Studies/Labs Reviewed:   EKG:  EKG is*** ordered today.  The ekg ordered today demonstrates ***  Recent Labs: 09/24/2019: ALT 42; BUN 18; Creatinine, Ser 0.92; Potassium 4.2; Sodium 142   Lipid Panel    Component Value Date/Time   CHOL 164 09/24/2019 0724   TRIG 98 09/24/2019 0724   HDL 60 09/24/2019 0724   CHOLHDL 2.7 09/24/2019 0724   CHOLHDL 4.2 03/17/2019 1046   LDLCALC 86 09/24/2019 0724   LDLCALC 152 (H) 03/17/2019 1046    Additional studies/ records that were reviewed today include:  CT cardiac scoring 05/20/2019  FINDINGS: Non-cardiac: See separate report from [facility]  Radiology.   Ascending Aorta: Normal   Pericardium: Normal   Coronary arteries: Normal origin.   IMPRESSION: Coronary calcium score of 117 . This was percentile 60 for age and sex matched control.   Kardie Tobb,DO     Electronically Signed   By: Berniece Salines MD   On: 05/20/2019 19:13      Risk Assessment/Calculations:   {Does this patient have ATRIAL FIBRILLATION?:[phone]}     ASSESSMENT:    No diagnosis found.   PLAN:  In order of problems listed above:  Elevated calcium score 117  Hyperlipidemia on rosuvastatin 20 mg daily  Strong family history of early CAD  Shared Decision Making/Informed Consent   {Are you ordering a CV Procedure (e.g. stress test, cath, DCCV, TEE, etc)?   Press F2        :[id number]}    Medication Adjustments/Labs and Tests Ordered: Current medicines are reviewed at length with the patient today.  Concerns regarding medicines are outlined above.  Medication changes, Labs and Tests ordered today are listed in the Patient Instructions below. There are no Patient Instructions on file for this visit.   Signed, Ermalinda Barrios, PA-C  09/08/2020 3:20 PM    [facility] [place], [place],   [address] Phone: [phone]; Fax: [phone]

## 2020-09-13 ENCOUNTER — Other Ambulatory Visit: Payer: No Typology Code available for payment source

## 2020-09-22 ENCOUNTER — Ambulatory Visit: Payer: No Typology Code available for payment source | Admitting: Physician Assistant

## 2020-09-22 DIAGNOSIS — I2510 Atherosclerotic heart disease of native coronary artery without angina pectoris: Secondary | ICD-10-CM

## 2020-09-22 DIAGNOSIS — E785 Hyperlipidemia, unspecified: Secondary | ICD-10-CM

## 2020-09-22 DIAGNOSIS — Z8249 Family history of ischemic heart disease and other diseases of the circulatory system: Secondary | ICD-10-CM

## 2020-11-06 IMAGING — CT CT CARDIAC CORONARY ARTERY CALCIUM SCORE
3 series · 14 of 20 positions shown, 15 images · non-contrast
Comparison: None.
COMPARISON: None.

Addendum:
EXAM:
OVER-READ INTERPRETATION  CT CHEST

The following report is an over-read performed by radiologist Dr.
Ramiar Tiger [REDACTED] on 05/20/2019. This over-read
does not include interpretation of cardiac or coronary anatomy or
pathology. The coronary calcium score interpretation by the
cardiologist is attached.
CLINICAL DATA: Risk stratification
Coronary Calcium Score
TECHNIQUE: The patient was scanned on a Siemens Force scanner. Axial
non-contrast 3 mm slices were carried out through the heart. The
data set was analyzed on a dedicated work station and scored using
the Agatson method.

[Series 2: casc 3.0 bv41 2 bestdiast 72 % · axial · 0.32mm/px · z∈[-252,-171]mm · 4 of 46 slices shown, 5 images]
[im 10/46  vessel]
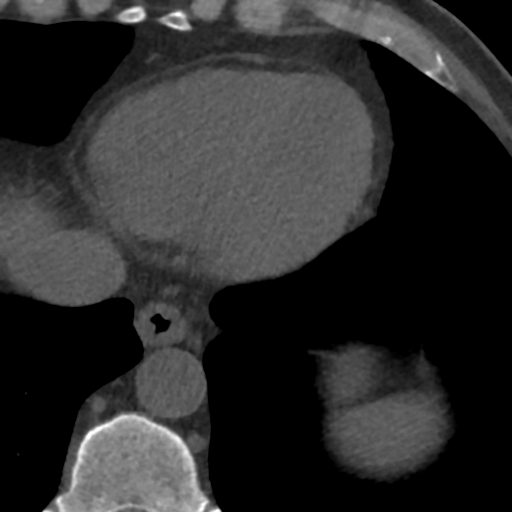
[im 10/46  lung]
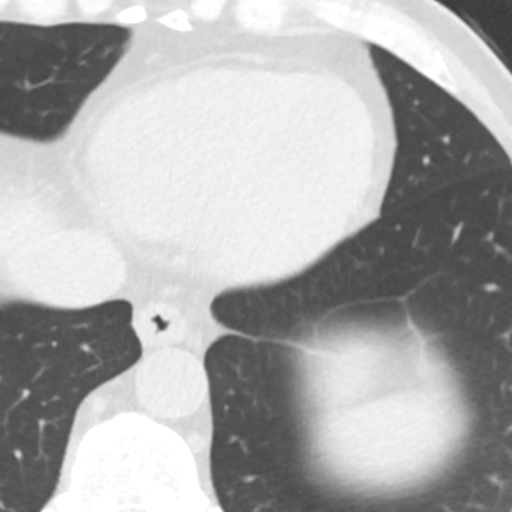
[im 19/46  vessel]
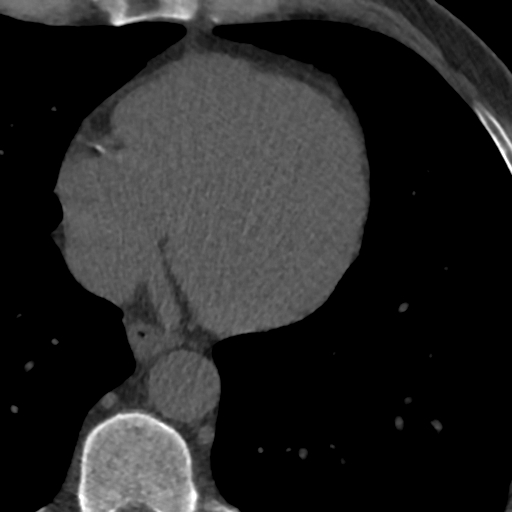
[im 28/46  vessel]
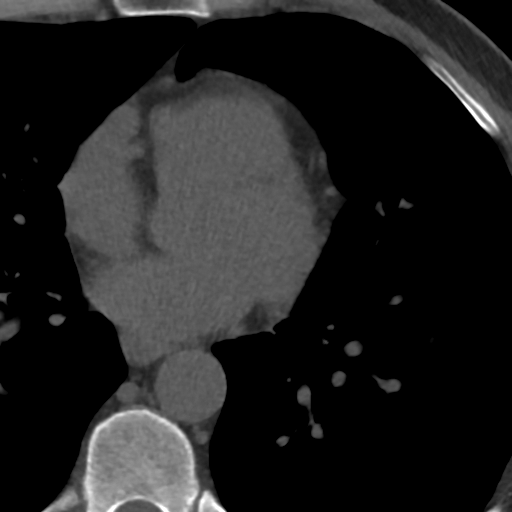
[im 37/46  vessel]
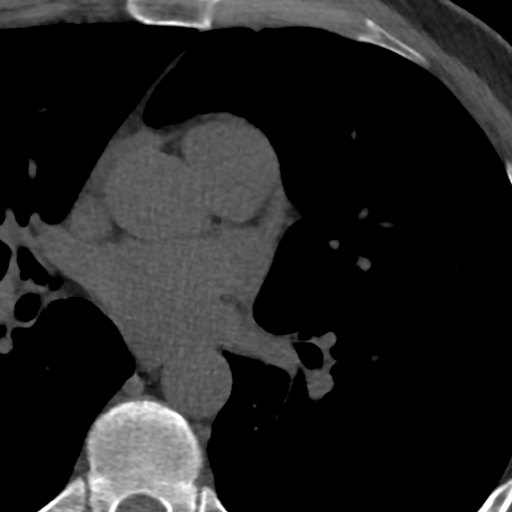

[Series 3: lung 71 % · axial · 0.66mm/px · z∈[-258,-165]mm · 5 of 47 slices shown]
[im 8/47  lung]
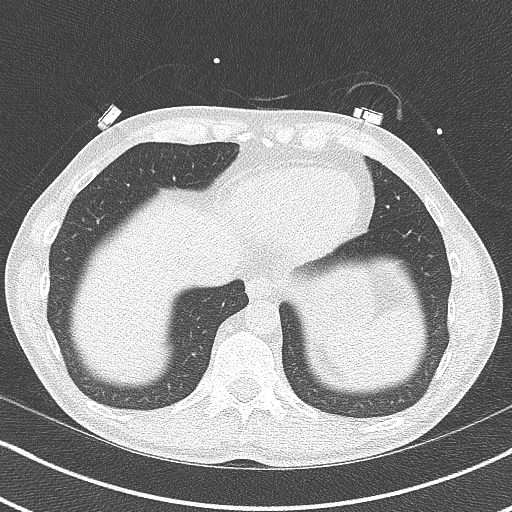
[im 16/47  lung]
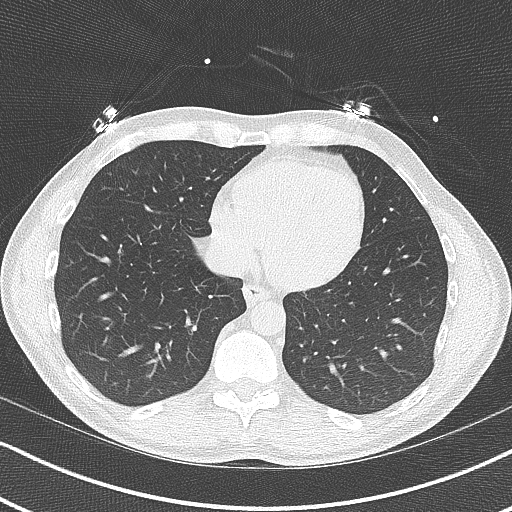
[im 24/47  lung]
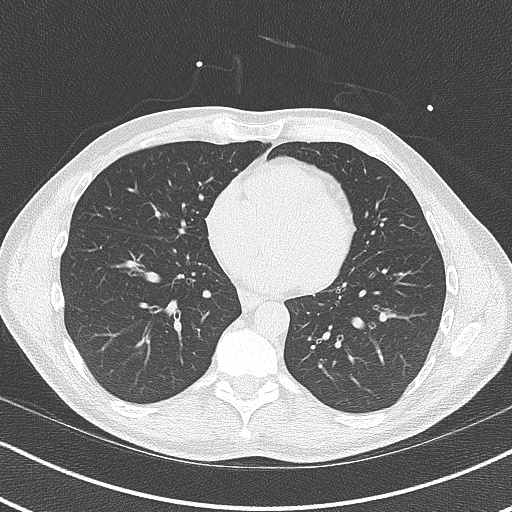
[im 31/47  lung]
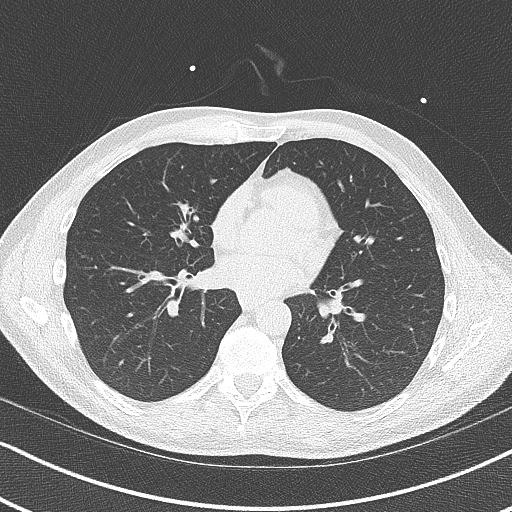
[im 39/47  lung]
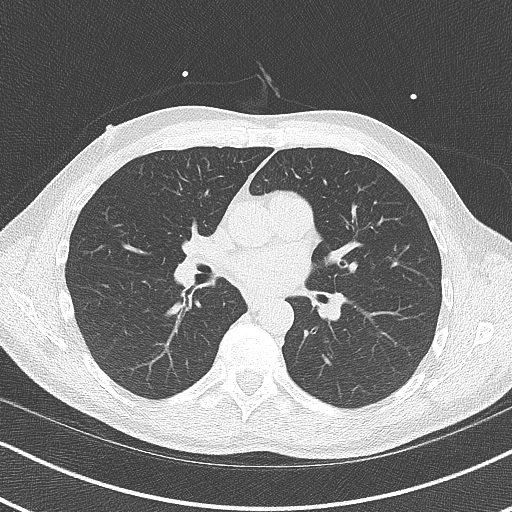

[Series 4: lung st 71 % · axial · 0.66mm/px · z∈[-258,-165]mm · 5 of 47 slices shown]
[im 8/47  lung]
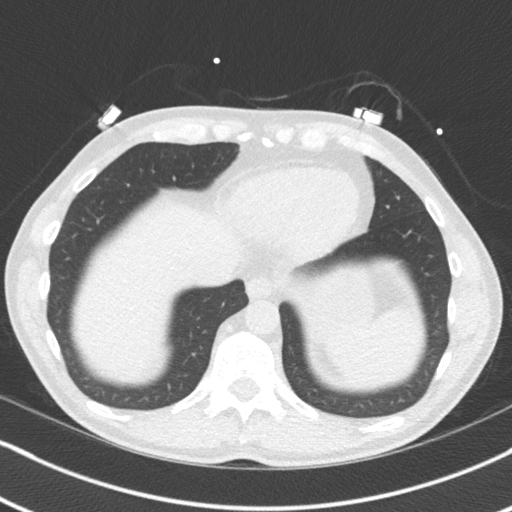
[im 16/47  lung]
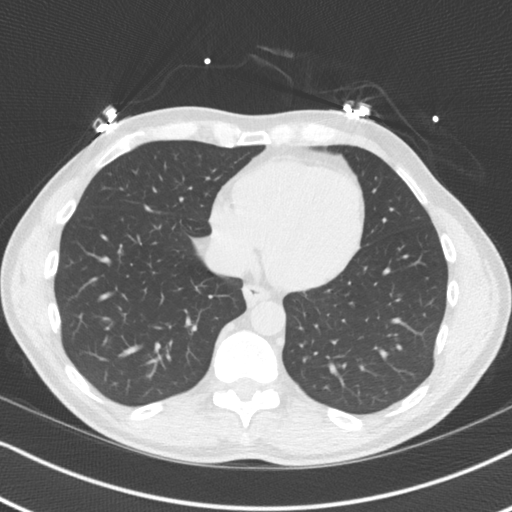
[im 24/47  lung]
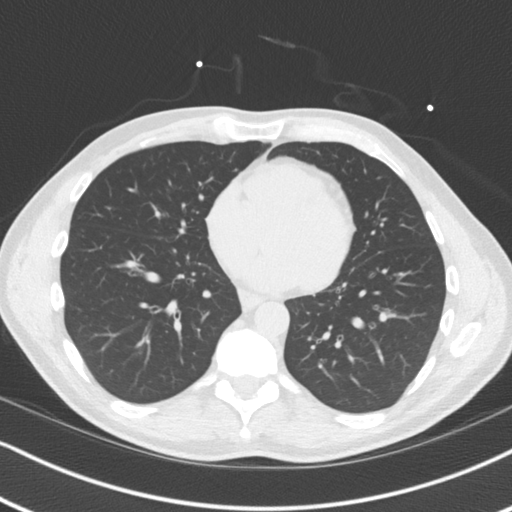
[im 31/47  lung]
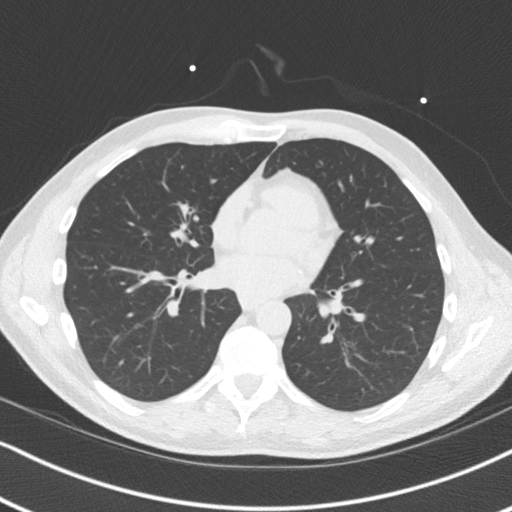
[im 39/47  lung]
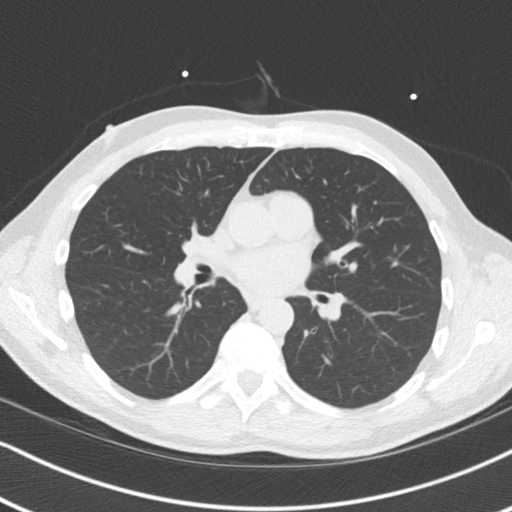

[14 of 20 positions shown; findings below may reference images not displayed]

FINDINGS: Vascular: Ascending thoracic aorta measures up to 3.0 cm. Descending
thoracic aorta measures 2.3 cm. Small amount of fluid in the
pericardial recess. Heart size is within normal limits. No
significant pericardial effusion.

Mediastinum/Nodes: Visualized mediastinal structures are
unremarkable.

Lungs/Pleura: Visualized lungs are clear. No large pleural
effusions.

Upper Abdomen: Visualized upper abdominal structures are
unremarkable.

Musculoskeletal: Unremarkable.
IMPRESSION: Negative over-read examination.
FINDINGS: Non-cardiac: See separate report from [REDACTED].

Ascending Aorta: Normal

Pericardium: Normal

Coronary arteries: Normal origin.
IMPRESSION: Coronary calcium score of 117 . This was percentile 83 for age and
sex matched control.

Milli Basile,DO

*** End of Addendum ***
EXAM:
OVER-READ INTERPRETATION  CT CHEST

The following report is an over-read performed by radiologist Dr.
Ramiar Tiger [REDACTED] on 05/20/2019. This over-read
does not include interpretation of cardiac or coronary anatomy or
pathology. The coronary calcium score interpretation by the
cardiologist is attached.
FINDINGS: Vascular: Ascending thoracic aorta measures up to 3.0 cm. Descending
thoracic aorta measures 2.3 cm. Small amount of fluid in the
pericardial recess. Heart size is within normal limits. No
significant pericardial effusion.

Mediastinum/Nodes: Visualized mediastinal structures are
unremarkable.

Lungs/Pleura: Visualized lungs are clear. No large pleural
effusions.

Upper Abdomen: Visualized upper abdominal structures are
unremarkable.

Musculoskeletal: Unremarkable.
IMPRESSION: Negative over-read examination.

## 2020-11-18 ENCOUNTER — Other Ambulatory Visit: Payer: Self-pay

## 2020-11-18 ENCOUNTER — Other Ambulatory Visit: Payer: No Typology Code available for payment source | Admitting: *Deleted

## 2020-11-18 DIAGNOSIS — E785 Hyperlipidemia, unspecified: Secondary | ICD-10-CM

## 2020-11-18 DIAGNOSIS — Z8249 Family history of ischemic heart disease and other diseases of the circulatory system: Secondary | ICD-10-CM

## 2020-11-18 DIAGNOSIS — R931 Abnormal findings on diagnostic imaging of heart and coronary circulation: Secondary | ICD-10-CM | POA: Diagnosis not present

## 2020-11-18 LAB — BASIC METABOLIC PANEL
BUN/Creatinine Ratio: 15 (ref 9–20)
BUN: 14 mg/dL (ref 6–24)
CO2: 23 mmol/L (ref 20–29)
Calcium: 9.6 mg/dL (ref 8.7–10.2)
Chloride: 102 mmol/L (ref 96–106)
Creatinine, Ser: 0.94 mg/dL (ref 0.76–1.27)
Glucose: 90 mg/dL (ref 65–99)
Potassium: 4.8 mmol/L (ref 3.5–5.2)
Sodium: 139 mmol/L (ref 134–144)
eGFR: 95 mL/min/{1.73_m2} (ref >59)

## 2020-11-18 LAB — HEPATIC FUNCTION PANEL
ALT: 37 IU/L (ref 0–44)
AST: 26 IU/L (ref 0–40)
Albumin: 4.8 g/dL (ref 3.8–4.9)
Alkaline Phosphatase: 85 IU/L (ref 44–121)
Bilirubin Total: 0.5 mg/dL (ref 0.0–1.2)
Bilirubin, Direct: 0.14 mg/dL (ref 0.00–0.40)
Total Protein: 7 g/dL (ref 6.0–8.5)

## 2020-11-18 LAB — LIPID PANEL
Chol/HDL Ratio: 3.4 ratio (ref 0.0–5.0)
Cholesterol, Total: 172 mg/dL (ref 100–199)
HDL: 50 mg/dL (ref >39)
LDL Chol Calc (NIH): 102 mg/dL — ABNORMAL HIGH (ref 0–99)
Triglycerides: 108 mg/dL (ref 0–149)
VLDL Cholesterol Cal: 20 mg/dL (ref 5–40)

## 2020-11-19 ENCOUNTER — Telehealth: Payer: Self-pay

## 2020-11-19 DIAGNOSIS — Z8249 Family history of ischemic heart disease and other diseases of the circulatory system: Secondary | ICD-10-CM

## 2020-11-19 DIAGNOSIS — E785 Hyperlipidemia, unspecified: Secondary | ICD-10-CM

## 2020-11-19 NOTE — Telephone Encounter (Signed)
The patient has been notified of the result and verbalized understanding.  All questions (if any) were answered.  Lipid clinic referral placed.  Precious Gilding, RN 11/19/2020 9:34 AM

## 2020-11-19 NOTE — Telephone Encounter (Signed)
-----   Message from Thayer Headings, MD sent at 11/18/2020  5:52 PM EDT ----- He has a strong family hx of CAD and has known coronary artery calcifications.  His goal LDL is 50-70. Please refer him to lipid clinic for consideration of PCSK9 inibitors

## 2020-11-28 ENCOUNTER — Encounter: Payer: Self-pay | Admitting: Cardiovascular Disease

## 2020-11-28 NOTE — Progress Notes (Signed)
Cardiology Office Note:    Date:  11/29/2020   ID:  Jason Chen, DOB 08/08/1965, MRN [id number]  PCP:  Jason Albee, MD (Inactive)  Cardiologist:  Jason Chen  Electrophysiologist:  None   Referring MD: Jason Albee, MD   Chief Complaint  Patient presents with   Hyperlipidemia     Previous notes:   Jason Chen is a 55 y.o. male with a hx of hyperlipidemia and a strong family history of premature coronary artery disease.  We are asked to see him today by Dr. Anastasio Chen for further evaluation and management of her of his hyperlipidemia and positive family history for CAD.  He was recently seen by Dr. Anastasio Chen for a physical exam.  His total cholesterol is 249.  His HDL is 59.  His LDL is 152.  Triglyceride level is 213. Vitamin D level is 29.  No cardiac complaints,  Plays golf and tennis, plays tennis 1-2 times a week.   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  Wine, beer occasionally  + fam hx of early CAD ,  Father died at age 43.  Mother also has CAD - recent CABG ,   elevated chol   Jan. 6, 2022: Jason Chen is seen today for follow up for his hyperlipidemia and strong family hx of CAD  No CP or dyspnea  Is on rosuvastatin 10 mg a day for hyperlipid Feb. 2021 - Coronary calcium score of 117 . This was percentile 83 for age and sex matched control.   November 29, 2020: Seen with wife Jason Chen.  Jason Chen is seen today for follow-up of his hyperlipidemia, strong family history of coronary artery disease and coronary artery calcifications. Recent labs reveal an LDL of 102.  This is up slightly from his last level of 86.   Actually takes his rosuvastatin 4-5 times a week .  We discussed regular exercise , discussed improving his diet     Past Medical History:  Diagnosis Date   BPH (benign prostatic hyperplasia) 03/17/2019   HLD (hyperlipidemia) 03/17/2019   Hyperlipemia     Wears glasses     Past Surgical History:  Procedure Laterality Date   KNEE ARTHROSCOPY W/ LATERAL RELEASE / MEDIAL IMBRICATION  2012   left   MASS EXCISION Left 04/07/2014   Procedure: EXCISION MASS LEFT THUMB ;  Surgeon: Leanora Cover, MD;  Location: [facility];  Service: Orthopedics;  Laterality: Left;   MOLE REMOVAL      Current Medications: Current Meds  Medication Sig   augmented betamethasone dipropionate (DIPROLENE-AF) 0.05 % cream Apply 1 application topically 2 (two) times daily.   rosuvastatin (CRESTOR) 20 MG tablet Take 1 tablet (20 mg total) by mouth daily.   tadalafil (CIALIS) 5 MG tablet Take 1 tablet (5 mg total) by mouth daily.     Allergies:   Patient has no known allergies.   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   Social Determinants of Health   Financial Resource Strain: Not on file  Food Insecurity: Not on file  Transportation Needs: Not on file  Physical Activity: Not on file  Stress: Not on file  Social Connections: Not on file     Family History: The patient's family history includes Cancer in his mother; Heart disease in his father and mother; Hyperlipidemia in his brother, brother, and daughter.  ROS:   Please see the history of present illness.     All other systems reviewed and are negative.  EKGs/Labs/Other Studies Reviewed:        Recent Labs: 11/18/2020:  ALT 37; BUN 14; Creatinine, Ser 0.94; Potassium 4.8; Sodium 139  Recent Lipid Panel    Component Value Date/Time   CHOL 172 11/18/2020 0839   TRIG 108 11/18/2020 0839   HDL 50 11/18/2020 0839   CHOLHDL 3.4 11/18/2020 0839   CHOLHDL 4.2 03/17/2019 1046   LDLCALC 102 (H) 11/18/2020 0839   LDLCALC 152 (H) 03/17/2019 1046    Physical Exam:    Physical Exam: Blood pressure 124/78, pulse 64, height '5\' 11"'$  (1.803 m), weight 159 lb 12.8 oz (72.5 kg), SpO2 97 %.  GEN:  Well nourished, well developed in no acute distress HEENT: Normal NECK: No JVD; No carotid bruits LYMPHATICS: No lymphadenopathy CARDIAC: RRR , no murmurs, rubs, gallops RESPIRATORY:  Clear to auscultation without rales, wheezing or rhonchi  ABDOMEN: Soft, non-tender, non-distended MUSCULOSKELETAL:  No edema; No deformity  SKIN: Warm and dry NEUROLOGIC:  Alert and oriented x 3     EKG:      ASSESSMENT:    1. Elevated coronary artery calcium score   2. Coronary artery calcification   3. Hyperlipidemia, unspecified hyperlipidemia type   4. Family history of premature CAD     PLAN:      1.  Hyperlipidemia:.  Presents for further evaluation of his hyperlipidemia.  His LDL numbers are up slightly compared to last year.  He admits that he has not been taking his rosuvastatin as often as he should.  He takes it perhaps 4-5 times a week.  He also has been eating a relatively high fat diet.  I encouraged him to work on adding more regular exercise into his routine.  He needs to work on his diet.  He will take his rosuvastatin daily.  We will recheck numbers in 3 months.  If his LDL remains elevated we will send him to the lipid clinic.  We will cancel his upcoming lipid clinic for this Friday because I do not think is necessary at this point.  2,.  Coronary artery calcifications:       Medication Adjustments/Labs and Tests Ordered: Current medicines are reviewed at length with the patient today.  Concerns regarding  medicines are outlined above.  Orders Placed This Encounter  Procedures   Lipid Profile    No orders of the defined types were placed in this encounter.    Patient Instructions  Medication Instructions:  Your physician recommends that you continue on your current medications as directed. Please refer to the Current Medication list given to you today.  *If you need a refill on your cardiac medications before your next appointment, please call your  pharmacy*   Lab Work: Your physician recommends that you return for lab work in: 3 months on Monday Nov. 21. You may come in anytime after 7:30 am.  You will need to FAST for this appointment - nothing to eat or drink after midnight the night before except water.  If you have labs (blood work) drawn today and your tests are completely normal, you will receive your results only by: [place] (if you have MyChart) OR A paper copy in the mail If you have any lab test that is abnormal or we need to change your treatment, we will call you to review the results.   Testing/Procedures: None Ordered   Follow-Up: At [facility], you and your health needs are our priority.  As part of our continuing mission to provide you with exceptional heart care, we have created designated Provider Care Teams.  These Care Teams include your primary Cardiologist (physician) and Advanced Practice Providers (APPs -  Physician Assistants and Nurse Practitioners) who all work together to provide you with the care you need, when you need it.   Your next appointment:   1 year(s)  The format for your next appointment:   In Person  Provider:   You may see Mertie Moores, MD or one of the following Advanced Practice Providers on your designated Care Team:   Richardson Dopp, PA-C Robbie Lis, [place]    Signed, Mertie Moores, MD  11/29/2020 8:34 AM    [place]

## 2020-11-29 ENCOUNTER — Ambulatory Visit: Payer: No Typology Code available for payment source | Admitting: Cardiovascular Disease

## 2020-11-29 ENCOUNTER — Encounter: Payer: Self-pay | Admitting: Cardiovascular Disease

## 2020-11-29 ENCOUNTER — Other Ambulatory Visit: Payer: Self-pay

## 2020-11-29 VITALS — BP 124/78 | HR 64 | Ht 71.0 in | Wt 159.8 lb

## 2020-11-29 DIAGNOSIS — E785 Hyperlipidemia, unspecified: Secondary | ICD-10-CM | POA: Diagnosis not present

## 2020-11-29 DIAGNOSIS — I2584 Coronary atherosclerosis due to calcified coronary lesion: Secondary | ICD-10-CM | POA: Diagnosis not present

## 2020-11-29 DIAGNOSIS — Z8249 Family history of ischemic heart disease and other diseases of the circulatory system: Secondary | ICD-10-CM | POA: Diagnosis not present

## 2020-11-29 DIAGNOSIS — I2510 Atherosclerotic heart disease of native coronary artery without angina pectoris: Secondary | ICD-10-CM | POA: Diagnosis not present

## 2020-11-29 DIAGNOSIS — R931 Abnormal findings on diagnostic imaging of heart and coronary circulation: Secondary | ICD-10-CM | POA: Diagnosis not present

## 2020-11-29 NOTE — Patient Instructions (Addendum)
Medication Instructions:  Your physician recommends that you continue on your current medications as directed. Please refer to the Current Medication list given to you today.  *If you need a refill on your cardiac medications before your next appointment, please call your pharmacy*   Lab Work: Your physician recommends that you return for lab work in: 3 months on Monday Nov. 21. You may come in anytime after 7:30 am.  You will need to FAST for this appointment - nothing to eat or drink after midnight the night before except water.  If you have labs (blood work) drawn today and your tests are completely normal, you will receive your results only by: [place] (if you have MyChart) OR A paper copy in the mail If you have any lab test that is abnormal or we need to change your treatment, we will call you to review the results.   Testing/Procedures: None Ordered   Follow-Up: At [facility], you and your health needs are our priority.  As part of our continuing mission to provide you with exceptional heart care, we have created designated Provider Care Teams.  These Care Teams include your primary Cardiologist (physician) and Advanced Practice Providers (APPs -  Physician Assistants and Nurse Practitioners) who all work together to provide you with the care you need, when you need it.   Your next appointment:   1 year(s)  The format for your next appointment:   In Person  Provider:   You may see Mertie Moores, MD or one of the following Advanced Practice Providers on your designated Care Team:   Richardson Dopp, PA-C [place], [place]

## 2020-12-02 DIAGNOSIS — H5213 Myopia, bilateral: Secondary | ICD-10-CM | POA: Diagnosis not present

## 2020-12-03 ENCOUNTER — Ambulatory Visit: Payer: No Typology Code available for payment source

## 2021-02-15 DIAGNOSIS — R350 Frequency of micturition: Secondary | ICD-10-CM | POA: Diagnosis not present

## 2021-02-15 DIAGNOSIS — N401 Enlarged prostate with lower urinary tract symptoms: Secondary | ICD-10-CM | POA: Diagnosis not present

## 2021-02-21 DIAGNOSIS — R972 Elevated prostate specific antigen [PSA]: Secondary | ICD-10-CM | POA: Diagnosis not present

## 2021-02-21 DIAGNOSIS — N401 Enlarged prostate with lower urinary tract symptoms: Secondary | ICD-10-CM | POA: Diagnosis not present

## 2021-02-21 DIAGNOSIS — R350 Frequency of micturition: Secondary | ICD-10-CM | POA: Diagnosis not present

## 2021-02-21 DIAGNOSIS — E291 Testicular hypofunction: Secondary | ICD-10-CM | POA: Diagnosis not present

## 2021-02-28 ENCOUNTER — Other Ambulatory Visit: Payer: No Typology Code available for payment source

## 2021-03-01 DIAGNOSIS — H5213 Myopia, bilateral: Secondary | ICD-10-CM | POA: Diagnosis not present

## 2021-03-01 DIAGNOSIS — H35372 Puckering of macula, left eye: Secondary | ICD-10-CM | POA: Diagnosis not present

## 2021-03-11 ENCOUNTER — Ambulatory Visit: Payer: Self-pay | Admitting: Cardiovascular Disease

## 2021-03-11 DIAGNOSIS — M25572 Pain in left ankle and joints of left foot: Secondary | ICD-10-CM | POA: Diagnosis not present

## 2021-03-16 ENCOUNTER — Encounter (HOSPITAL_BASED_OUTPATIENT_CLINIC_OR_DEPARTMENT_OTHER): Payer: Self-pay | Admitting: Nurse Practitioner

## 2021-03-16 ENCOUNTER — Telehealth: Payer: Self-pay | Admitting: Cardiovascular Disease

## 2021-03-16 DIAGNOSIS — E785 Hyperlipidemia, unspecified: Secondary | ICD-10-CM

## 2021-03-16 DIAGNOSIS — I2584 Coronary atherosclerosis due to calcified coronary lesion: Secondary | ICD-10-CM

## 2021-03-16 DIAGNOSIS — R931 Abnormal findings on diagnostic imaging of heart and coronary circulation: Secondary | ICD-10-CM

## 2021-03-16 DIAGNOSIS — I2510 Atherosclerotic heart disease of native coronary artery without angina pectoris: Secondary | ICD-10-CM

## 2021-03-16 NOTE — Telephone Encounter (Signed)
Called patient back about his message. Patient complaining about taking rosuvastatin and wondering if there are other options. Informed patient that we could have him see the lipid clinic. Patient agreed to see lipid clinic.

## 2021-03-16 NOTE — Telephone Encounter (Signed)
Pt c/o medication issue:  1. Name of Medication: rosuvastatin (CRESTOR) 20 MG tablet  2. How are you currently taking this medication (dosage and times per day)?  AS DIRECTED  3. Are you having a reaction (difficulty breathing--STAT)? NO  4. What is your medication issue? PT IS REQUESTING TO STOP THIS MEDICINE BECAUSE IT IS MAKING HIM FEEL BAD

## 2021-03-16 NOTE — Addendum Note (Signed)
Addended by: Emmaline Life on: 03/16/2021 04:54 PM   Modules accepted: Orders

## 2021-03-16 NOTE — Telephone Encounter (Signed)
Referral placed to Pharm D for lipid consultation

## 2021-03-17 NOTE — Telephone Encounter (Signed)
error 

## 2021-03-23 ENCOUNTER — Ambulatory Visit: Payer: Self-pay

## 2021-04-25 ENCOUNTER — Other Ambulatory Visit: Payer: Self-pay | Admitting: Cardiovascular Disease

## 2021-04-27 NOTE — Progress Notes (Signed)
Patient ID: Jason Chen                 DOB: 04/11/1965                    MRN: [id number]     HPI: Jason Chen is a 56 y.o. male patient referred to lipid clinic by Dr. Acie Fredrickson. PMH is significant for HLD and strong family hx of premature CAD, BPH. 05/2019 coronary calcium score was elevated at 117 (83rd percentile for age/sex matched controls). Rosuvastatin 10 mg daily was initiated. At next follow up, LDL improved from 150s to 86 on rosuvastatin 10mg  daily. LDL was still above goal and rosuvastatin was increased to 20 mg daily. At follow up visit on 11/29/20, pt reports only taking rosuvastatin 20mg  4-5x per week. LDL up slightly from 86 to 102 on panel. He also reported that he had been eating a relatively high fat diet. Increased rosuvastatin 20mg  back to daily. On 03/16/21, pt reported that rosuvastatin was "making him feel bad" and was referred to the lipid clinic for further discussion.  Today, pt presents in good spirits. When asked about why he was only taking his rosuvastatin 20 mg 4-5x per week, he states he just gets busy and often forgets to take it. His wife was under the belief the rosuvastatin was making him more irritable, pt does not believe this is the case. He has no other complaints about side effects, but was curious to learn about potential side effects. He has not been as active with golf and tennis lately and is not focused on eating healthy currently.   Current Medications: rosuvastatin 20 mg daily - not taking consistently Intolerances: simvastatin 20 mg daily (unsure why stopped) Risk Factors: FHx of premature CAD, elevated CAC score LDL goal: <70 mg/dL due to premature ASCVD  Diet: a poor diet including lots of fast food  Exercise: Plays golf and tennis, plays tennis 1-2 times a week. Is active, lifts light weights on occasion  Family History: fam hx of early CAD: Father died at age 6.  Mother also has CAD,recent CABG, elevated chol; Hyperlipidemia in his  brother, brother, and daughter.  Social History: Cigars on occasion, Wine, beer occasionally - 5 drinks per week  Labs:  11/18/2020: TC 172, HDL 50, TGL 108, LDL 102 (rosuvastatin 20 mg 4-5x/week) 10/24/2019: TC 164, HDL 60, TGL 98, LDL 86 (rosuvastatin 10 mg daily)  Past Medical History:  Diagnosis Date   BPH (benign prostatic hyperplasia) 03/17/2019   HLD (hyperlipidemia) 03/17/2019   Hyperlipemia    Wears glasses     Current Outpatient Medications on File Prior to Visit  Medication Sig Dispense Refill   augmented betamethasone dipropionate (DIPROLENE-AF) 0.05 % cream Apply 1 application topically 2 (two) times daily.     rosuvastatin (CRESTOR) 20 MG tablet TAKE 1 TABLET BY MOUTH DAILY. 90 tablet 3   tadalafil (CIALIS) 5 MG tablet Take 1 tablet (5 mg total) by mouth daily. 30 tablet 3   tamsulosin (FLOMAX) 0.4 MG CAPS capsule Take 0.4 mg by mouth as needed. (Patient not taking: Reported on 11/29/2020)     No current facility-administered medications on file prior to visit.    No Known Allergies  Assessment/Plan:  1. Hyperlipidemia - LDL is elevated above goal of <70 mg/dL given elevated CAC score. At last LDL check in 11/2020, pt was not taking rosuvastatin 20mg  on a regular basis, which is the likely reason for the bump in LDL  from previous labs on lower dose of rosuvastatin. Patient is agreeable to being more consistent with daily dosing of rosuvastatin 20mg . Encouraged patient to focus on a heart healthy diet and increasing tennis/golf activities. Will follow up with a lipid panel in 8 weeks to assess LDL levels on consistent, high-dose statin therapy. Can consider addition of Zetia 10mg  daily in future, if elevated above goal at f/u.  Jethro Poling, PharmD Student assisted with this visit.  Megan E. Supple, PharmD, BCACP, [place] [address] [place], [place] [address] Phone: [phone]; Fax: [phone] 04/28/2021 12:54 PM

## 2021-04-28 ENCOUNTER — Ambulatory Visit (INDEPENDENT_AMBULATORY_CARE_PROVIDER_SITE_OTHER): Payer: BC Managed Care – PPO | Admitting: Pharmacist

## 2021-04-28 ENCOUNTER — Other Ambulatory Visit: Payer: Self-pay

## 2021-04-28 DIAGNOSIS — I2584 Coronary atherosclerosis due to calcified coronary lesion: Secondary | ICD-10-CM

## 2021-04-28 DIAGNOSIS — I2510 Atherosclerotic heart disease of native coronary artery without angina pectoris: Secondary | ICD-10-CM

## 2021-04-28 DIAGNOSIS — E782 Mixed hyperlipidemia: Secondary | ICD-10-CM | POA: Diagnosis not present

## 2021-04-28 NOTE — Patient Instructions (Addendum)
Take your rosuvastatin 20mg  every day.  Your LDL goal is <70 mg/dL. Your last LDL level in 11/2020 was 102.  Recheck fasting labs on Tuesday, March 14th any time after 7:30am

## 2021-05-19 DIAGNOSIS — L570 Actinic keratosis: Secondary | ICD-10-CM | POA: Diagnosis not present

## 2021-05-19 DIAGNOSIS — Z85820 Personal history of malignant melanoma of skin: Secondary | ICD-10-CM | POA: Diagnosis not present

## 2021-05-19 DIAGNOSIS — L821 Other seborrheic keratosis: Secondary | ICD-10-CM | POA: Diagnosis not present

## 2021-05-19 DIAGNOSIS — D1801 Hemangioma of skin and subcutaneous tissue: Secondary | ICD-10-CM | POA: Diagnosis not present

## 2021-06-21 ENCOUNTER — Other Ambulatory Visit: Payer: BC Managed Care – PPO

## 2021-08-03 ENCOUNTER — Other Ambulatory Visit: Payer: BC Managed Care – PPO | Admitting: *Deleted

## 2021-08-03 DIAGNOSIS — E785 Hyperlipidemia, unspecified: Secondary | ICD-10-CM

## 2021-08-03 DIAGNOSIS — E782 Mixed hyperlipidemia: Secondary | ICD-10-CM | POA: Diagnosis not present

## 2021-08-03 DIAGNOSIS — R931 Abnormal findings on diagnostic imaging of heart and coronary circulation: Secondary | ICD-10-CM

## 2021-08-03 DIAGNOSIS — Z8249 Family history of ischemic heart disease and other diseases of the circulatory system: Secondary | ICD-10-CM

## 2021-08-03 DIAGNOSIS — I2510 Atherosclerotic heart disease of native coronary artery without angina pectoris: Secondary | ICD-10-CM

## 2021-08-04 LAB — HEPATIC FUNCTION PANEL
ALT: 37 IU/L (ref 0–44)
AST: 29 IU/L (ref 0–40)
Albumin: 4.3 g/dL (ref 3.8–4.9)
Alkaline Phosphatase: 91 IU/L (ref 44–121)
Bilirubin Total: 0.5 mg/dL (ref 0.0–1.2)
Bilirubin, Direct: 0.16 mg/dL (ref 0.00–0.40)
Total Protein: 6.6 g/dL (ref 6.0–8.5)

## 2021-08-04 LAB — LIPID PANEL
Chol/HDL Ratio: 2.6 ratio (ref 0.0–5.0)
Cholesterol, Total: 151 mg/dL (ref 100–199)
HDL: 59 mg/dL (ref >39)
LDL Chol Calc (NIH): 75 mg/dL (ref 0–99)
Triglycerides: 92 mg/dL (ref 0–149)
VLDL Cholesterol Cal: 17 mg/dL (ref 5–40)

## 2021-08-06 NOTE — Progress Notes (Signed)
?Cardiology Office Note:   ? ?Date:  08/09/2021  ? ?ID:  Jason Chen, DOB Oct 17, 1965, MRN [id number] ? ?PCP:  Jason Margarita, DO ?  ?[facility] Providers ?Cardiologist:  Jason Moores, MD    ? ?Referring MD: Jason Margarita, DO  ? ?Chief Complaint: follow-up hyperlipidemia ? ?History of Present Illness:   ? ?Jason Chen is a 56 y.o. male with a hx of coronary artery calcification seen on CT, hyperlipidemia, BPH, and strong family history of CAD.  ? ?Referred by PCP for evaluation of hyperlipidemia and strong family history of CAD and seen by Dr. Acie Chen on 04/25/2019. CT calcium score 05/2019 revealed coronary calcium score of 117, (83rd percentile for age/sex matched controls). Rosuvastatin was started and LDL improved, however he later had a bump in LDL and admitted not taking rosuvastatin daily. Referred to lipid clinic and seen by Jason Chen, Jason Chen on 04/28/21. Plan to focus on rosuvastatin 20 mg daily, healthy diet, and regular exercise. LDL improved from 102 to 75 on 08/03/21. ? ?Today, he is here alone for follow-up.  He reports he has been working out on a more consistent basis since January 2023.  Has increased frequency of rosuvastatin '20mg'$  to 5 days a week consistently. Also, improved diet with more vegetables and whole grains, less soda and more water, less sugar.  Continues to be active doing cardio and weightlifting 2 to 3 days/week, plays tennis on occasion and also coughs frequently.  Has noticed a couple of episodes of palpitations accompanied by facial flushing particularly when playing tennis. These resolved quickly. He denies chest pain, shortness of breath, lower extremity edema, fatigue, melena, hematuria, hemoptysis, diaphoresis, weakness, presyncope, syncope, orthopnea, and PND. ? ?Past Medical History:  ?Diagnosis Date  ? BPH (benign prostatic hyperplasia) 03/17/2019  ? HLD (hyperlipidemia) 03/17/2019  ? Hyperlipemia   ? Wears glasses   ? ? ?Past Surgical History:  ?Procedure Laterality  Date  ? KNEE ARTHROSCOPY W/ LATERAL RELEASE / MEDIAL IMBRICATION  2012  ? left  ? MASS EXCISION Left 04/07/2014  ? Procedure: EXCISION MASS LEFT THUMB ;  Surgeon: Jason Cover, MD;  Location: [facility];  Service: Orthopedics;  Laterality: Left;  ? MOLE REMOVAL    ? ? ?Current Medications: ?Current Meds  ?Medication Sig  ? rosuvastatin (CRESTOR) 20 MG tablet TAKE 1 TABLET BY MOUTH DAILY.  ? tadalafil (CIALIS) 5 MG tablet Take 1 tablet (5 mg total) by mouth daily.  ?  ? ?Allergies:   Patient has no known allergies.  ? ?Social History  ? ?Socioeconomic History  ? Marital status: Married  ?  Spouse name: Not on file  ? Number of children: Not on file  ? Years of education: Not on file  ? Highest education level: Not on file  ?Occupational History  ? Not on file  ?Tobacco Use  ? Smoking status: Never  ? Smokeless tobacco: Never  ?Substance and Sexual Activity  ? Alcohol use: Yes  ?  Alcohol/week: 5.0 standard drinks  ?  Types: 5 Cans of beer per week  ? Drug use: No  ? Sexual activity: Yes  ?  Comment: occ cigar  ?Other Topics Concern  ? Not on file  ?Social History Narrative  ? 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  ? ?Social Determinants of Health  ? ?Financial Resource Strain: Not on file  ?Food Insecurity: Not on file  ?Transportation Needs: Not on file  ?Physical Activity: Not on file  ?Stress: Not on file  ?  Social Connections: Not on file  ?  ? ?Family History: ?The patient's family history includes Cancer in his mother; Heart disease in his father and mother; Hyperlipidemia in his brother, brother, and daughter. ? ?ROS:   ?Please see the history of present illness.  All other systems reviewed and are negative. ? ?Labs/Other Studies Reviewed:   ? ?The following studies were reviewed today: ? ?CT Calcium Score 05/20/19 ? ?Ascending Aorta: Normal ?  ?Pericardium: Normal ?  ?Coronary arteries: Normal origin. ?  ?IMPRESSION: ?Coronary calcium score of 117 . This was percentile 59 for age  and ?sex matched control. ? ? ?Recent Labs: ?11/18/2020: BUN 14; Creatinine, Ser 0.94; Potassium 4.8; Sodium 139 ?08/03/2021: ALT 37  ?Recent Lipid Panel ?   ?Component Value Date/Time  ? CHOL 151 08/03/2021 0750  ? TRIG 92 08/03/2021 0750  ? HDL 59 08/03/2021 0750  ? CHOLHDL 2.6 08/03/2021 0750  ? CHOLHDL 4.2 03/17/2019 1046  ? [place] 75 08/03/2021 0750  ? LDLCALC 152 (H) 03/17/2019 1046  ? ? ? ?Risk Assessment/Calculations:   ?  ? ? ?Physical Exam:   ? ?VS:  BP 104/70   Pulse 70   Ht '5\' 11"'$  (1.803 m)   Wt 161 lb 3.2 oz (73.1 kg)   SpO2 96%   BMI 22.48 kg/m?    ? ?Wt Readings from Last 3 Encounters:  ?08/09/21 161 lb 3.2 oz (73.1 kg)  ?11/29/20 159 lb 12.8 oz (72.5 kg)  ?04/15/20 165 lb (74.8 kg)  ?  ? ?GEN:  Well nourished, well developed in no acute distress ?HEENT: Normal ?NECK: No JVD; No carotid bruits ?CARDIAC: RRR, no murmurs, rubs, gallops ?RESPIRATORY:  Clear to auscultation without rales, wheezing or rhonchi  ?ABDOMEN: Soft, non-tender, non-distended ?MUSCULOSKELETAL:  No edema; No deformity. 2+ pedal pulses, equal bilaterally ?SKIN: Warm and dry ?NEUROLOGIC:  Alert and oriented x 3 ?PSYCHIATRIC:  Normal affect  ? ?EKG:  EKG is ordered today.  The ekg ordered today demonstrates NSR at 70 bpm, no ST/T wave abnormality ? ?Diagnoses:   ? ?1. Hyperlipidemia LDL goal <70   ?2. Coronary artery calcification   ?3. Palpitations   ? ?Assessment and Plan:   ? ? ?Elevated coronary calcium score: Calcium score 117 by CT on 05/2019. LDL goal < 70. Has improved compliance with his rosuvastatin.  He is also eating a more plant-based heart healthy diet and exercising on a consistent basis. He denies chest pain, dyspnea, or other symptoms concerning for angina.  No indication for further ischemic evaluation at this time ? ?Hyperlipidemia LDL goal < 70: Lipid panel 08/03/21 looks good, with LDL 75, HDL 59, triglycerides 92. He has made significant improvements in his diet and continues to work on incorporating more whole  grains and plant-based foods. Is avoiding sugar. Taking rosuvastatin at least 5 days/week.  Encouraged continued work on diet and exercise, increase rosuvastatin to 7 days/week since he is not having any concerning symptoms at current dose.  This will likely get him to goal of LDL less than 70.  Establishing care with new PCP in a few weeks, if lipid panel not done would favor NMR lipid panel in 3 months. ? ?Palpitations: Rare episodes of feeling fast heart rate accompanied by flushing of face, particularly when playing tennis.  He will monitor and notify me if these occur more frequently.  Advised increased hydration with electrolytes when participating in aerobic sports.  ?   ?Disposition: 1 year with Dr. Acie Chen or APP ? ? ?  Medication Adjustments/Labs and Tests Ordered: ?Current medicines are reviewed at length with the patient today.  Concerns regarding medicines are outlined above.  ?Orders Placed This Encounter  ?Procedures  ? EKG 12-Lead  ? ?No orders of the defined types were placed in this encounter. ? ? ?Patient Instructions  ?Medication Instructions:  ? ?Your physician recommends that you continue on your current medications as directed. Please refer to the Current Medication list given to you today. ? ? ?*If you need a refill on your cardiac medications before your next appointment, please call your pharmacy* ? ? ?Lab Work: ? ?None ordered!! ? ?If you have labs (blood work) drawn today and your tests are completely normal, you will receive your results only by: ?MyChart Message (if you have MyChart) OR ?A paper copy in the mail ?If you have any lab test that is abnormal or we need to change your treatment, we will call you to review the results. ? ? ?Testing/Procedures: ? ? ?None ordered!! ? ? ?Follow-Up: ?At [facility], you and your health needs are our priority.  As part of our continuing mission to provide you with exceptional heart care, we have created designated Provider Care Teams.  These Care  Teams include your primary Cardiologist (physician) and Advanced Practice Providers (APPs -  Physician Assistants and Nurse Practitioners) who all work together to provide you with the care you need, when yo

## 2021-08-09 ENCOUNTER — Encounter: Payer: Self-pay | Admitting: Nurse Practitioner

## 2021-08-09 ENCOUNTER — Ambulatory Visit (INDEPENDENT_AMBULATORY_CARE_PROVIDER_SITE_OTHER): Payer: BC Managed Care – PPO | Admitting: Nurse Practitioner

## 2021-08-09 ENCOUNTER — Other Ambulatory Visit: Payer: BC Managed Care – PPO

## 2021-08-09 VITALS — BP 104/70 | HR 70 | Ht 71.0 in | Wt 161.2 lb

## 2021-08-09 DIAGNOSIS — I2510 Atherosclerotic heart disease of native coronary artery without angina pectoris: Secondary | ICD-10-CM | POA: Diagnosis not present

## 2021-08-09 DIAGNOSIS — E785 Hyperlipidemia, unspecified: Secondary | ICD-10-CM | POA: Diagnosis not present

## 2021-08-09 DIAGNOSIS — I2584 Coronary atherosclerosis due to calcified coronary lesion: Secondary | ICD-10-CM | POA: Diagnosis not present

## 2021-08-09 DIAGNOSIS — R002 Palpitations: Secondary | ICD-10-CM | POA: Diagnosis not present

## 2021-08-09 NOTE — Patient Instructions (Signed)
Medication Instructions:  ? ?Your physician recommends that you continue on your current medications as directed. Please refer to the Current Medication list given to you today. ? ? ?*If you need a refill on your cardiac medications before your next appointment, please call your pharmacy* ? ? ?Lab Work: ? ?None ordered!! ? ?If you have labs (blood work) drawn today and your tests are completely normal, you will receive your results only by: ?MyChart Message (if you have MyChart) OR ?A paper copy in the mail ?If you have any lab test that is abnormal or we need to change your treatment, we will call you to review the results. ? ? ?Testing/Procedures: ? ? ?None ordered!! ? ? ?Follow-Up: ?At [facility], you and your health needs are our priority.  As part of our continuing mission to provide you with exceptional heart care, we have created designated Provider Care Teams.  These Care Teams include your primary Cardiologist (physician) and Advanced Practice Providers (APPs -  Physician Assistants and Nurse Practitioners) who all work together to provide you with the care you need, when you need it. ? ?We recommend signing up for the patient portal called "MyChart".  Sign up information is provided on this After Visit Summary.  MyChart is used to connect with patients for Virtual Visits (Telemedicine).  Patients are able to view lab/test results, encounter notes, upcoming appointments, etc.  Non-urgent messages can be sent to your provider as well.   ?To learn more about what you can do with MyChart, go to [email or web address].   ? ?Your next appointment:   ?1 year(s) ? ?The format for your next appointment:   ?In Person ? ?Provider:   ?Christen Bame, NP       ? ? ?Other Instructions ? ?Your physician wants you to follow-up in: 1 year with Christen Bame, NP.  You will receive a reminder letter in the mail two months in advance. If you don't receive a letter, please call our office to schedule the follow-up  appointment. ? ? ?Important Information About Sugar ? ? ? ? ?  ?

## 2021-09-19 DIAGNOSIS — R350 Frequency of micturition: Secondary | ICD-10-CM | POA: Diagnosis not present

## 2021-09-19 DIAGNOSIS — N401 Enlarged prostate with lower urinary tract symptoms: Secondary | ICD-10-CM | POA: Diagnosis not present

## 2021-09-19 DIAGNOSIS — R972 Elevated prostate specific antigen [PSA]: Secondary | ICD-10-CM | POA: Diagnosis not present

## 2021-09-20 DIAGNOSIS — E785 Hyperlipidemia, unspecified: Secondary | ICD-10-CM | POA: Diagnosis not present

## 2021-11-25 ENCOUNTER — Ambulatory Visit: Payer: BC Managed Care – PPO | Admitting: Cardiovascular Disease

## 2022-01-17 DIAGNOSIS — R350 Frequency of micturition: Secondary | ICD-10-CM | POA: Diagnosis not present

## 2022-01-17 DIAGNOSIS — N401 Enlarged prostate with lower urinary tract symptoms: Secondary | ICD-10-CM | POA: Diagnosis not present

## 2022-02-20 DIAGNOSIS — R350 Frequency of micturition: Secondary | ICD-10-CM | POA: Diagnosis not present

## 2022-02-20 DIAGNOSIS — R3912 Poor urinary stream: Secondary | ICD-10-CM | POA: Diagnosis not present

## 2022-02-20 DIAGNOSIS — R972 Elevated prostate specific antigen [PSA]: Secondary | ICD-10-CM | POA: Diagnosis not present

## 2022-02-20 DIAGNOSIS — N401 Enlarged prostate with lower urinary tract symptoms: Secondary | ICD-10-CM | POA: Diagnosis not present

## 2022-02-21 DIAGNOSIS — Z125 Encounter for screening for malignant neoplasm of prostate: Secondary | ICD-10-CM | POA: Diagnosis not present

## 2022-02-21 DIAGNOSIS — E785 Hyperlipidemia, unspecified: Secondary | ICD-10-CM | POA: Diagnosis not present

## 2022-02-22 DIAGNOSIS — Z23 Encounter for immunization: Secondary | ICD-10-CM | POA: Diagnosis not present

## 2022-02-22 DIAGNOSIS — Z1331 Encounter for screening for depression: Secondary | ICD-10-CM | POA: Diagnosis not present

## 2022-02-22 DIAGNOSIS — Z1389 Encounter for screening for other disorder: Secondary | ICD-10-CM | POA: Diagnosis not present

## 2022-02-22 DIAGNOSIS — E785 Hyperlipidemia, unspecified: Secondary | ICD-10-CM | POA: Diagnosis not present

## 2022-03-10 DIAGNOSIS — M25521 Pain in right elbow: Secondary | ICD-10-CM | POA: Diagnosis not present

## 2022-05-23 ENCOUNTER — Other Ambulatory Visit: Payer: Self-pay | Admitting: Cardiovascular Disease

## 2022-06-21 DIAGNOSIS — M1712 Unilateral primary osteoarthritis, left knee: Secondary | ICD-10-CM | POA: Diagnosis not present

## 2022-08-17 DIAGNOSIS — L570 Actinic keratosis: Secondary | ICD-10-CM | POA: Diagnosis not present

## 2022-08-17 DIAGNOSIS — Z85820 Personal history of malignant melanoma of skin: Secondary | ICD-10-CM | POA: Diagnosis not present

## 2022-08-17 DIAGNOSIS — L814 Other melanin hyperpigmentation: Secondary | ICD-10-CM | POA: Diagnosis not present

## 2022-08-17 DIAGNOSIS — L821 Other seborrheic keratosis: Secondary | ICD-10-CM | POA: Diagnosis not present

## 2022-08-17 DIAGNOSIS — L578 Other skin changes due to chronic exposure to nonionizing radiation: Secondary | ICD-10-CM | POA: Diagnosis not present

## 2022-10-04 DIAGNOSIS — L0889 Other specified local infections of the skin and subcutaneous tissue: Secondary | ICD-10-CM | POA: Diagnosis not present

## 2022-10-04 DIAGNOSIS — L309 Dermatitis, unspecified: Secondary | ICD-10-CM | POA: Diagnosis not present

## 2022-10-20 ENCOUNTER — Encounter: Payer: Self-pay | Admitting: Cardiovascular Disease

## 2022-10-24 ENCOUNTER — Ambulatory Visit: Payer: BC Managed Care – PPO | Attending: Cardiovascular Disease | Admitting: Cardiovascular Disease

## 2022-10-24 ENCOUNTER — Encounter: Payer: Self-pay | Admitting: Cardiovascular Disease

## 2022-10-24 VITALS — BP 112/76 | HR 71 | Ht 71.0 in | Wt 157.4 lb

## 2022-10-24 DIAGNOSIS — I2510 Atherosclerotic heart disease of native coronary artery without angina pectoris: Secondary | ICD-10-CM

## 2022-10-24 DIAGNOSIS — I2584 Coronary atherosclerosis due to calcified coronary lesion: Secondary | ICD-10-CM | POA: Diagnosis not present

## 2022-10-24 DIAGNOSIS — E785 Hyperlipidemia, unspecified: Secondary | ICD-10-CM

## 2022-10-24 NOTE — Patient Instructions (Addendum)
Your Coronary calcium score of 117 . This was percentile 64 for age and sex matched control.  Given this we want your LDL to be < 70   Will check lipids today   Medication Instructions:  Your physician recommends that you continue on your current medications as directed. Please refer to the Current Medication list given to you today.  *If you need a refill on your cardiac medications before your next appointment, please call your pharmacy*   Lab Work: Lipids, ALT, BMET today If you have labs (blood work) drawn today and your tests are completely normal, you will receive your results only by: MyChart Message (if you have MyChart) OR A paper copy in the mail If you have any lab test that is abnormal or we need to change your treatment, we will call you to review the results.   Testing/Procedures: NONE   Follow-Up: At [facility], you and your health needs are our priority.  As part of our continuing mission to provide you with exceptional heart care, we have created designated Provider Care Teams.  These Care Teams include your primary Cardiologist (physician) and Advanced Practice Providers (APPs -  Physician Assistants and Nurse Practitioners) who all work together to provide you with the care you need, when you need it.  We recommend signing up for the patient portal called "MyChart".  Sign up information is provided on this After Visit Summary.  MyChart is used to connect with patients for Virtual Visits (Telemedicine).  Patients are able to view lab/test results, encounter notes, upcoming appointments, etc.  Non-urgent messages can be sent to your provider as well.   To learn more about what you can do with MyChart, go to [email or web address].    Your next appointment:   1 year(s)  Provider:   Kristeen Miss, MD

## 2022-10-24 NOTE — Progress Notes (Signed)
Cardiology Office Note:    Date:  10/24/2022   ID:  Jason Chen, DOB December 01, 1965, MRN [id number]  PCP:  Charlane Ferretti, DO  Cardiologist:  Kiearra Oyervides  Electrophysiologist:  None   Referring MD: Charlane Ferretti, DO   Chief Complaint  Patient presents with   Hyperlipidemia     Previous notes:   Jason Chen is a 57 y.o. male with a hx of hyperlipidemia and a strong family history of premature coronary artery disease.  We are asked to see him today by Dr. Karilyn Cota for further evaluation and management of her of his hyperlipidemia and positive family history for CAD.  He was recently seen by Dr. Karilyn Cota for a physical exam.  His total cholesterol is 249.  His HDL is 59.  His LDL is 152.  Triglyceride level is 213. Vitamin D level is 29.  No cardiac complaints,  Plays golf and tennis, plays tennis 1-2 times a week.   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  Wine, beer occasionally  + fam hx of early CAD ,  Father died at age 55.  Mother also has CAD - recent CABG ,   elevated chol   Jan. 6, 2022: Jason Chen is seen today for follow up for his hyperlipidemia and strong family hx of CAD  No CP or dyspnea  Is on rosuvastatin 10 mg a day for hyperlipid Feb. 2021 - Coronary calcium score of 117 . This was percentile 9 for age and sex matched control.   November 29, 2020: Seen with wife Victorino Dike.  Jason Chen is seen today for follow-up of his hyperlipidemia, strong family history of coronary artery disease and coronary artery calcifications. Recent labs reveal an LDL of 102.  This is up slightly from his last level of 86.   Actually takes his rosuvastatin 4-5 times a week .  We discussed regular exercise , discussed improving his diet    October 24, 2022 Jason Chen is seen for follow up for his lipids   Staying active  Check labs today    Past Medical History:  Diagnosis Date   BPH (benign  prostatic hyperplasia) 03/17/2019   HLD (hyperlipidemia) 03/17/2019   Hyperlipemia    Wears glasses     Past Surgical History:  Procedure Laterality Date   KNEE ARTHROSCOPY W/ LATERAL RELEASE / MEDIAL IMBRICATION  2012   left   MASS EXCISION Left 04/07/2014   Procedure: EXCISION MASS LEFT THUMB ;  Surgeon: Betha Loa, MD;  Location: [facility];  Service: Orthopedics;  Laterality: Left;   MOLE REMOVAL      Current Medications: Current Meds  Medication Sig   augmented betamethasone dipropionate (DIPROLENE-AF) 0.05 % cream Apply 1 Application topically 2 (two) times daily.   rosuvastatin (CRESTOR) 20 MG tablet TAKE 1 TABLET BY MOUTH DAILY.   tadalafil (CIALIS) 5 MG tablet Take 1 tablet (5 mg total) by mouth daily.     Allergies:   Patient has no known allergies.   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   Social Determinants of Health   Financial Resource Strain: Not on file  Food Insecurity: Not on file  Transportation Needs: Not on file  Physical Activity: Not on file  Stress: Not on file  Social Connections: Not on file     Family History: The patient's family history includes Cancer in his mother; Heart disease in his father and mother; Hyperlipidemia in his brother, brother, and daughter.  ROS:   Please see the history of present illness.     All other systems reviewed and are  negative.  EKGs/Labs/Other Studies Reviewed:        Recent Labs: No results found for requested labs within last 365 days.  Recent Lipid Panel    Component Value Date/Time   CHOL 151 08/03/2021 0750   TRIG 92 08/03/2021 0750   HDL 59 08/03/2021 0750   CHOLHDL 2.6 08/03/2021 0750   CHOLHDL 4.2 03/17/2019 1046   LDLCALC 75 08/03/2021 0750   LDLCALC 152 (H) 03/17/2019 1046    Physical Exam:    Physical Exam: Blood pressure 112/76, pulse 71, height 5\' 11"  (1.803 m), weight 157 lb 6.4 oz (71.4 kg), SpO2 96%.       GEN:  Well nourished, well developed in no acute distress HEENT: Normal NECK: No JVD; No carotid bruits LYMPHATICS: No lymphadenopathy CARDIAC: RRR , no murmurs, rubs, gallops RESPIRATORY:  Clear to auscultation without rales, wheezing or rhonchi  ABDOMEN: Soft, non-tender, non-distended MUSCULOSKELETAL:  No edema; No deformity  SKIN: Warm and dry NEUROLOGIC:  Alert and oriented x 3   EKG:      ASSESSMENT:    1. Hyperlipidemia LDL goal <70   2. Coronary artery calcification      PLAN:      1.  Hyperlipidemia:.    Continue rosuvastatin   he denies any chest pain   His LDL goal is < 70     2,.  Coronary artery calcifications:    Coronary calcium score of 117 . This was percentile 27 for age and sex matched control.  Cont rosuvastatin , goal LDL is < 70    Medication Adjustments/Labs and Tests Ordered: Current medicines are reviewed at length with the patient today.  Concerns regarding medicines are outlined above.  Orders Placed This Encounter  Procedures   Lipid panel   ALT   Basic metabolic panel    No orders of the defined types were placed in this encounter.     Patient Instructions  Your Coronary calcium score of 117 . This was percentile 80 for age and sex matched control.  Given this we want your LDL to be < 70   Will check lipids today   Medication Instructions:  Your physician recommends that you continue on your  current medications as directed. Please refer to the Current Medication list given to you today.  *If you need a refill on your cardiac medications before your next appointment, please call your pharmacy*   Lab Work: Lipids, ALT, BMET today If you have labs (blood work) drawn today and your tests are completely normal, you will receive your results only by: [employer] (  if you have MyChart) OR A paper copy in the mail If you have any lab test that is abnormal or we need to change your treatment, we will call you to review the results.   Testing/Procedures: NONE   Follow-Up: At [facility], you and your health needs are our priority.  As part of our continuing mission to provide you with exceptional heart care, we have created designated Provider Care Teams.  These Care Teams include your primary Cardiologist (physician) and Advanced Practice Providers (APPs -  Physician Assistants and Nurse Practitioners) who all work together to provide you with the care you need, when you need it.  We recommend signing up for the patient portal called "MyChart".  Sign up information is provided on this After Visit Summary.  MyChart is used to connect with patients for Virtual Visits (Telemedicine).  Patients are able to view lab/test results, encounter notes, upcoming appointments, etc.  Non-urgent messages can be sent to your provider as well.   To learn more about what you can do with MyChart, go to [email or web address].    Your next appointment:   1 year(s)  Provider:   Kristeen Miss, MD         Signed, Kristeen Miss, MD  10/24/2022 12:05 PM    [facility]

## 2022-10-25 LAB — BASIC METABOLIC PANEL
BUN/Creatinine Ratio: 12 (ref 9–20)
BUN: 11 mg/dL (ref 6–24)
CO2: 25 mmol/L (ref 20–29)
Calcium: 9.4 mg/dL (ref 8.7–10.2)
Chloride: 103 mmol/L (ref 96–106)
Creatinine, Ser: 0.9 mg/dL (ref 0.76–1.27)
Glucose: 97 mg/dL (ref 70–99)
Potassium: 4.8 mmol/L (ref 3.5–5.2)
Sodium: 142 mmol/L (ref 134–144)
eGFR: 100 mL/min/{1.73_m2} (ref >59)

## 2022-10-25 LAB — LIPID PANEL
Chol/HDL Ratio: 2.8 ratio (ref 0.0–5.0)
Cholesterol, Total: 182 mg/dL (ref 100–199)
HDL: 64 mg/dL (ref >39)
LDL Chol Calc (NIH): 89 mg/dL (ref 0–99)
Triglycerides: 171 mg/dL — ABNORMAL HIGH (ref 0–149)
VLDL Cholesterol Cal: 29 mg/dL (ref 5–40)

## 2022-10-25 LAB — ALT: ALT: 27 IU/L (ref 0–44)

## 2022-11-16 DIAGNOSIS — H5213 Myopia, bilateral: Secondary | ICD-10-CM | POA: Diagnosis not present

## 2022-11-16 DIAGNOSIS — H35372 Puckering of macula, left eye: Secondary | ICD-10-CM | POA: Diagnosis not present

## 2022-11-27 ENCOUNTER — Telehealth: Payer: Self-pay | Admitting: Cardiovascular Disease

## 2022-11-27 NOTE — Telephone Encounter (Signed)
Patient would like to know if Eligha Bridegroom, NP can review his labs and give him a call back.

## 2022-11-28 NOTE — Telephone Encounter (Signed)
Returned call to patient to review recent labs ordered by Dr Elease Hashimoto and resulted via [employer]. Left message asking that he call back to clarify his concerns or if truly needing to speak to [facility].

## 2022-11-29 NOTE — Telephone Encounter (Signed)
Hi Sarah,   I saw Sonu in the past and he is a friend of my husband's. He asked me to be his provider once Dr. Elease Hashimoto retired. He mentioned that he was concerned that his cholesterol was increased and asked me to take a look. I will follow-up with him.   Thank you, Marcelino Duster

## 2022-12-06 DIAGNOSIS — E785 Hyperlipidemia, unspecified: Secondary | ICD-10-CM | POA: Diagnosis not present

## 2023-02-14 DIAGNOSIS — R350 Frequency of micturition: Secondary | ICD-10-CM | POA: Diagnosis not present

## 2023-02-14 DIAGNOSIS — N401 Enlarged prostate with lower urinary tract symptoms: Secondary | ICD-10-CM | POA: Diagnosis not present

## 2023-02-19 DIAGNOSIS — E785 Hyperlipidemia, unspecified: Secondary | ICD-10-CM | POA: Diagnosis not present

## 2023-02-21 DIAGNOSIS — E291 Testicular hypofunction: Secondary | ICD-10-CM | POA: Diagnosis not present

## 2023-02-21 DIAGNOSIS — N401 Enlarged prostate with lower urinary tract symptoms: Secondary | ICD-10-CM | POA: Diagnosis not present

## 2023-02-21 DIAGNOSIS — R972 Elevated prostate specific antigen [PSA]: Secondary | ICD-10-CM | POA: Diagnosis not present

## 2023-02-21 DIAGNOSIS — R350 Frequency of micturition: Secondary | ICD-10-CM | POA: Diagnosis not present

## 2023-02-27 DIAGNOSIS — Z0000 Encounter for general adult medical examination without abnormal findings: Secondary | ICD-10-CM | POA: Diagnosis not present

## 2023-02-27 DIAGNOSIS — Z1331 Encounter for screening for depression: Secondary | ICD-10-CM | POA: Diagnosis not present

## 2023-02-27 DIAGNOSIS — E291 Testicular hypofunction: Secondary | ICD-10-CM | POA: Diagnosis not present

## 2023-02-27 DIAGNOSIS — Z1339 Encounter for screening examination for other mental health and behavioral disorders: Secondary | ICD-10-CM | POA: Diagnosis not present

## 2023-02-27 DIAGNOSIS — Z23 Encounter for immunization: Secondary | ICD-10-CM | POA: Diagnosis not present

## 2023-02-27 DIAGNOSIS — M7021 Olecranon bursitis, right elbow: Secondary | ICD-10-CM | POA: Diagnosis not present

## 2023-07-03 DIAGNOSIS — Z79899 Other long term (current) drug therapy: Secondary | ICD-10-CM | POA: Diagnosis not present

## 2023-07-03 DIAGNOSIS — R41840 Attention and concentration deficit: Secondary | ICD-10-CM | POA: Diagnosis not present

## 2023-07-09 DIAGNOSIS — Z79899 Other long term (current) drug therapy: Secondary | ICD-10-CM | POA: Diagnosis not present

## 2023-07-09 DIAGNOSIS — R41840 Attention and concentration deficit: Secondary | ICD-10-CM | POA: Diagnosis not present

## 2023-07-11 ENCOUNTER — Encounter: Payer: Self-pay | Admitting: Cardiovascular Disease

## 2023-08-08 DIAGNOSIS — Z79899 Other long term (current) drug therapy: Secondary | ICD-10-CM | POA: Diagnosis not present

## 2023-08-08 DIAGNOSIS — F902 Attention-deficit hyperactivity disorder, combined type: Secondary | ICD-10-CM | POA: Diagnosis not present

## 2023-08-16 DIAGNOSIS — R400 Somnolence: Secondary | ICD-10-CM | POA: Diagnosis not present

## 2023-08-21 DIAGNOSIS — F902 Attention-deficit hyperactivity disorder, combined type: Secondary | ICD-10-CM | POA: Diagnosis not present

## 2023-08-21 DIAGNOSIS — Z79899 Other long term (current) drug therapy: Secondary | ICD-10-CM | POA: Diagnosis not present

## 2023-09-17 DIAGNOSIS — H04123 Dry eye syndrome of bilateral lacrimal glands: Secondary | ICD-10-CM | POA: Diagnosis not present

## 2023-09-20 ENCOUNTER — Encounter: Payer: Self-pay | Admitting: Cardiovascular Disease

## 2023-10-01 ENCOUNTER — Other Ambulatory Visit: Payer: Self-pay | Admitting: Cardiovascular Disease

## 2023-10-02 DIAGNOSIS — Z85820 Personal history of malignant melanoma of skin: Secondary | ICD-10-CM | POA: Diagnosis not present

## 2023-10-02 DIAGNOSIS — L738 Other specified follicular disorders: Secondary | ICD-10-CM | POA: Diagnosis not present

## 2023-10-02 DIAGNOSIS — L814 Other melanin hyperpigmentation: Secondary | ICD-10-CM | POA: Diagnosis not present

## 2023-10-02 DIAGNOSIS — L550 Sunburn of first degree: Secondary | ICD-10-CM | POA: Diagnosis not present

## 2023-10-02 DIAGNOSIS — L570 Actinic keratosis: Secondary | ICD-10-CM | POA: Diagnosis not present

## 2023-11-01 DIAGNOSIS — R350 Frequency of micturition: Secondary | ICD-10-CM | POA: Diagnosis not present

## 2023-11-01 DIAGNOSIS — N401 Enlarged prostate with lower urinary tract symptoms: Secondary | ICD-10-CM | POA: Diagnosis not present

## 2023-11-01 DIAGNOSIS — E291 Testicular hypofunction: Secondary | ICD-10-CM | POA: Diagnosis not present

## 2023-11-08 DIAGNOSIS — R350 Frequency of micturition: Secondary | ICD-10-CM | POA: Diagnosis not present

## 2023-11-08 DIAGNOSIS — R972 Elevated prostate specific antigen [PSA]: Secondary | ICD-10-CM | POA: Diagnosis not present

## 2023-11-08 DIAGNOSIS — N401 Enlarged prostate with lower urinary tract symptoms: Secondary | ICD-10-CM | POA: Diagnosis not present

## 2023-11-09 ENCOUNTER — Other Ambulatory Visit: Payer: Self-pay | Admitting: Urology

## 2023-11-09 DIAGNOSIS — R972 Elevated prostate specific antigen [PSA]: Secondary | ICD-10-CM

## 2023-11-12 ENCOUNTER — Encounter: Payer: Self-pay | Admitting: Urology

## 2023-12-06 ENCOUNTER — Other Ambulatory Visit: Payer: Self-pay | Admitting: Nurse Practitioner

## 2023-12-06 MED ORDER — ROSUVASTATIN CALCIUM 20 MG PO TABS: 20.0000 mg | ORAL_TABLET | Freq: Every day | ORAL | 0 refills | Status: DC

## 2024-01-11 ENCOUNTER — Ambulatory Visit: Admitting: Cardiovascular Disease

## 2024-02-04 ENCOUNTER — Ambulatory Visit: Admitting: Cardiology

## 2024-03-11 DIAGNOSIS — E291 Testicular hypofunction: Secondary | ICD-10-CM | POA: Diagnosis not present

## 2024-03-18 DIAGNOSIS — Z0000 Encounter for general adult medical examination without abnormal findings: Secondary | ICD-10-CM | POA: Diagnosis not present

## 2024-03-18 DIAGNOSIS — R400 Somnolence: Secondary | ICD-10-CM | POA: Diagnosis not present

## 2024-03-18 DIAGNOSIS — Z1339 Encounter for screening examination for other mental health and behavioral disorders: Secondary | ICD-10-CM | POA: Diagnosis not present

## 2024-03-18 DIAGNOSIS — Z1331 Encounter for screening for depression: Secondary | ICD-10-CM | POA: Diagnosis not present

## 2024-03-18 DIAGNOSIS — Z23 Encounter for immunization: Secondary | ICD-10-CM | POA: Diagnosis not present

## 2024-04-23 NOTE — Progress Notes (Signed)
 "   Chief Complaint  Patient presents with   Follow-up    CAD, HLD   History of Present Illness: 59 yo male with history of hyperlipidemia and CAD (abnormal calcium  score) who is here today for follow up. He has been followed in our office by Dr. Alveta. Coronary CT calcium  score of 117. He has been on Crestor .   He is here today for follow up. The patient denies any chest pain, palpitations, lower extremity edema, orthopnea, PND, dizziness, near syncope or syncope. He has dyspnea with exertion, especially when climbing stairs. His father had an MI at age 27. He does not like taking Crestor  and believes it may cause fatigue.   Primary Care Physician: Valentin Skates, DO   Past Medical History:  Diagnosis Date   BPH (benign prostatic hyperplasia) 03/17/2019   CAD (coronary artery disease)    HLD (hyperlipidemia) 03/17/2019   Hyperlipemia    Wears glasses     Past Surgical History:  Procedure Laterality Date   KNEE ARTHROSCOPY W/ LATERAL RELEASE / MEDIAL IMBRICATION  2012   left   MASS EXCISION Left 04/07/2014   Procedure: EXCISION MASS LEFT THUMB ;  Surgeon: Franky Curia, MD;  Location: [facility];  Service: Orthopedics;  Laterality: Left;   MOLE REMOVAL      Current Outpatient Medications  Medication Sig Dispense Refill   aspirin  EC 81 MG tablet Take 1 tablet (81 mg total) by mouth daily. Swallow whole.     augmented betamethasone dipropionate (DIPROLENE-AF) 0.05 % cream Apply 1 Application topically 2 (two) times daily.     metoprolol  tartrate (LOPRESSOR ) 100 MG tablet Take one tablet by mouth 2 hours prior to CT scan 1 tablet 0   tadalafil  (CIALIS ) 5 MG tablet Take 1 tablet (5 mg total) by mouth daily. 30 tablet 3   cetirizine (ZYRTEC ALLERGY) 10 MG tablet Take 10 mg by mouth daily.     rosuvastatin  (CRESTOR ) 20 MG tablet Take 1 tablet (20 mg total) by mouth daily. 90 tablet 3   triamcinolone  cream (KENALOG) 0.1 % Apply 1 Application topically as needed.     No  current facility-administered medications for this visit.    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   Social Drivers of Health   Tobacco Use: Low Risk (04/24/2024)   Patient History    Smoking Tobacco Use: Never    Smokeless Tobacco Use: Never    Passive Exposure: Not on file  Financial Resource Strain: Not on file  Food Insecurity: Not on file  Transportation Needs: Not on file  Physical Activity: Not on file  Stress: Not on file  Social Connections: Not on file  Intimate Partner Violence: Not on file  Depression (EYV7-0): Not on file  Alcohol   Screen: Not on file  Housing: Not on file  Utilities: Not on file  Health Literacy: Not on file    Family History  Problem Relation Age of Onset   Heart disease Mother    Cancer Mother    Heart disease Father    Hyperlipidemia Brother    Hyperlipidemia Daughter    Hyperlipidemia Brother     Review of Systems:  As stated in the HPI and otherwise negative.   BP 116/78   Pulse 67   Ht 5' 11 (1.803 m)   Wt 162 lb 8 oz (73.7 kg)   SpO2 98%   BMI 22.66 kg/m   Physical Examination: General: Well developed, well nourished, NAD  HEENT: OP clear, mucus membranes moist  SKIN: warm, dry. No rashes. Neuro: No focal deficits  Musculoskeletal: Muscle strength 5/5 all ext  Psychiatric: Mood and affect normal  Neck: No JVD, no carotid bruits,  no thyromegaly, no lymphadenopathy.  Lungs:Clear bilaterally, no wheezes, rhonci, crackles Cardiovascular: Regular rate and rhythm. No murmurs, gallops or rubs. Abdomen:Soft. Bowel sounds present. Non-tender.  Extremities: No lower extremity edema. Pulses are 2 + in the bilateral DP/PT.  EKG:  EKG is ordered today. The ekg ordered today demonstrates  EKG Interpretation Date/Time:  Thursday April 24 2024 09:17:15 EST Ventricular Rate:  67 PR Interval:  144 QRS Duration:  82 QT Interval:  372 QTC Calculation: 393 R Axis:   60  Text Interpretation: Normal sinus rhythm Normal ECG Confirmed by Verlin Bruckner [phone]) on 04/24/2024 9:33:33 AM    Recent Labs: No results found for requested labs within last 365 days.   Lipid Panel    Component Value Date/Time   CHOL 182 10/24/2022 1219   TRIG 171 (H) 10/24/2022 1219   HDL 64 10/24/2022 1219   CHOLHDL 2.8 10/24/2022 1219   CHOLHDL 4.2 03/17/2019 1046   LDLCALC 89 10/24/2022 1219   LDLCALC 152 (H) 03/17/2019 1046     Wt Readings from Last 3 Encounters:  04/24/24 162 lb 8 oz (73.7 kg)  10/24/22 157 lb 6.4 oz (71.4 kg)  08/09/21 161 lb 3.2 oz (73.1 kg)    Assessment and Plan:   1. CAD with angina: Abnormal coronary calcium  score in 2017. No chest pain but he is having dyspnea on exertion. His father had an MI at age 12. His dyspnea may be an anginal equivalent.  -Continue Crestor  -Start ASA 81 mg daily -Will arrange a coronary CTA  -Echo to assess LV function  2. Hyperlipidemia: LDL 89 in July 2024.  -Continue Crestor  -He mentioned considering Repatha. He believes his Crestor  makes him feel fatigued. Will discuss at f/u visit  3. Dyspnea on exertion: See above.  -Cardiac CTA and echo.   Labs/ tests ordered today include:   Orders Placed This Encounter  Procedures   CT CORONARY MORPH W/CTA COR W/SCORE W/CA W/CM &/OR WO/CM   Basic Metabolic Panel (BMET)   EKG 12-Lead   ECHOCARDIOGRAM COMPLETE   Disposition:    F/U with me in 3-4 months  Signed, Bruckner Verlin, MD, [facility] 04/24/2024 10:07 AM    [facility] [address] [place], [place], [place]  [address] Phone: [phone]; Fax: [phone]       [1] No Known Allergies  "

## 2024-04-24 ENCOUNTER — Ambulatory Visit: Attending: Cardiovascular Disease | Admitting: Cardiovascular Disease

## 2024-04-24 ENCOUNTER — Encounter: Payer: Self-pay | Admitting: Cardiovascular Disease

## 2024-04-24 VITALS — BP 116/78 | HR 67 | Ht 71.0 in | Wt 162.5 lb

## 2024-04-24 DIAGNOSIS — R0609 Other forms of dyspnea: Secondary | ICD-10-CM

## 2024-04-24 DIAGNOSIS — I2510 Atherosclerotic heart disease of native coronary artery without angina pectoris: Secondary | ICD-10-CM | POA: Diagnosis not present

## 2024-04-24 DIAGNOSIS — E785 Hyperlipidemia, unspecified: Secondary | ICD-10-CM

## 2024-04-24 LAB — BASIC METABOLIC PANEL WITH GFR
BUN/Creatinine Ratio: 16 (ref 9–20)
BUN: 16 mg/dL (ref 6–24)
CO2: 22 mmol/L (ref 20–29)
Calcium: 9.8 mg/dL (ref 8.7–10.2)
Chloride: 102 mmol/L (ref 96–106)
Creatinine, Ser: 1.03 mg/dL (ref 0.76–1.27)
Glucose: 95 mg/dL (ref 70–99)
Potassium: 4.7 mmol/L (ref 3.5–5.2)
Sodium: 140 mmol/L (ref 134–144)
eGFR: 84 mL/min/1.73

## 2024-04-24 MED ORDER — ASPIRIN 81 MG PO TBEC: 81.0000 mg | DELAYED_RELEASE_TABLET | Freq: Every day | ORAL | Status: AC

## 2024-04-24 MED ORDER — METOPROLOL TARTRATE 100 MG PO TABS: ORAL_TABLET | ORAL | 0 refills | Status: AC

## 2024-04-24 MED ORDER — ROSUVASTATIN CALCIUM 20 MG PO TABS: 20.0000 mg | ORAL_TABLET | Freq: Every day | ORAL | 3 refills | Status: AC

## 2024-04-24 NOTE — Patient Instructions (Addendum)
 Medication Instructions:  Start aspirin  81 mg by mouth daily *If you need a refill on your cardiac medications before your next appointment, please call your pharmacy*  Lab Work: Have lab work (BMP) drawn today in the lab on the first floor If you have labs (blood work) drawn today and your tests are completely normal, you will receive your results only by: MyChart Message (if you have MyChart) OR A paper copy in the mail If you have any lab test that is abnormal or we need to change your treatment, we will call you to review the results.  Testing/Procedures: Your physician has requested that you have an echocardiogram. Echocardiography is a painless test that uses sound waves to create images of your heart. It provides your doctor with information about the size and shape of your heart and how well your hearts chambers and valves are working. This procedure takes approximately one hour. There are no restrictions for this procedure. Please do NOT wear cologne, perfume, aftershave, or lotions (deodorant is allowed). Please arrive 15 minutes prior to your appointment time.  Please note: We ask at that you not bring children with you during ultrasound (echo/ vascular) testing. Due to room size and safety concerns, children are not allowed in the ultrasound rooms during exams. Our front office staff cannot provide observation of children in our lobby area while testing is being conducted. An adult accompanying a patient to their appointment will only be allowed in the ultrasound room at the discretion of the ultrasound technician under special circumstances. We apologize for any inconvenience.  Your physician has requested that you have cardiac CT. Cardiac computed tomography (CT) is a painless test that uses an x-ray machine to take clear, detailed pictures of your heart. For further information please visit [email or web address] Please follow instruction sheet as given.    Follow-Up: At [facility], you and your health needs are our priority.  As part of our continuing mission to provide you with exceptional heart care, our providers are all part of one team.  This team includes your primary Cardiologist (physician) and Advanced Practice Providers or APPs (Physician Assistants and Nurse Practitioners) who all work together to provide you with the care you need, when you need it.  Your next appointment:   3-4  month(s)  Provider:   Lonni Cash, MD    We recommend signing up for the patient portal called MyChart.  Sign up information is provided on this After Visit Summary.  MyChart is used to connect with patients for Virtual Visits (Telemedicine).  Patients are able to view lab/test results, encounter notes, upcoming appointments, etc.  Non-urgent messages can be sent to your provider as well.   To learn more about what you can do with MyChart, go to [email or web address].   Other Instructions   Your cardiac CT will be scheduled at one of the below locations:   [facility] [address] [place], [place] [address] [phone] (Severe contrast allergies only)  OR   [facility] [address] [place], [place] [address] [phone]  OR   [facility] [address] [place], [place] [address] [phone]  OR   [facility] [address]  [place], [place] [address]  OR   [facility] [address] [place], [place] [phone]  If scheduled at [facility], please arrive at the [facility] and Children's Entrance (Entrance C2) of [facility] 30  minutes prior to test start time. You can use the FREE valet parking offered at entrance C (encouraged to control the heart rate for the test)  Proceed to the [facility] Radiology Department (first floor) to check-in and test prep.  All radiology patients and guests should use entrance C2 at  [facility], accessed from [facility], even though the hospital's physical address listed is [address].  If scheduled at the Heart and Vascular Tower at [address], please enter the parking lot using the [address] entrance and use the FREE valet service at the patient drop-off area. Enter the building and check-in with registration on the main floor.  If scheduled at [facility], please arrive to the Heart and Vascular Center 15 mins early for check-in and test prep.  There is spacious parking and easy access to the radiology department from the [facility] Heart and Vascular entrance. Please enter here and check-in with the desk attendant.   If scheduled at [facility], please arrive 30 minutes early for check-in and test prep.  Please follow these instructions carefully (unless otherwise directed):  An IV will be required for this test and Nitroglycerin will be given.  Hold all erectile dysfunction medications at least 3 days (72 hrs) prior to test. (Ie viagra, cialis , sildenafil, tadalafil , etc)   On the Night Before the Test: Be sure to Drink plenty of water. Do not consume any caffeinated/decaffeinated beverages or chocolate 12 hours prior to your test. Do not take any antihistamines 12 hours prior to your test.  On the Day of the Test: Drink plenty of water until 1 hour prior to the test. Do not eat any food 1 hour prior to test. You may take your regular medications prior to the test.  Take metoprolol  (Lopressor ) two hours prior to test. If you take Furosemide/Hydrochlorothiazide/Spironolactone/Chlorthalidone, please HOLD on the morning of the test. Patients who wear a continuous glucose monitor MUST remove the device prior to scanning.  After the Test: Drink plenty of water. After receiving IV contrast, you may experience a mild flushed feeling. This is normal. On occasion, you may experience a mild rash up to  24 hours after the test. This is not dangerous. If this occurs, you can take Benadryl 25 mg, Zyrtec, Claritin, or Allegra and increase your fluid intake. (Patients taking Tikosyn should avoid Benadryl, and may take Zyrtec, Claritin, or Allegra) If you experience trouble breathing, this can be serious. If it is severe call 911 IMMEDIATELY. If it is mild, please call our office.  We will call to schedule your test 2-4 weeks out understanding that some insurance companies will need an authorization prior to the service being performed.   For more information and frequently asked questions, please visit our website : [email or web address]  For non-scheduling related questions, please contact the cardiac imaging nurse navigator should you have any questions/concerns: Cardiac Imaging Nurse Navigators Direct Office Dial: [phone]   For scheduling needs, including cancellations and rescheduling, please call Brittany, [phone].

## 2024-04-25 ENCOUNTER — Ambulatory Visit: Payer: Self-pay | Admitting: Cardiovascular Disease

## 2024-05-20 ENCOUNTER — Ambulatory Visit (HOSPITAL_COMMUNITY)

## 2024-05-29 ENCOUNTER — Ambulatory Visit (HOSPITAL_COMMUNITY)

## 2024-07-24 ENCOUNTER — Ambulatory Visit: Admitting: Cardiovascular Disease
